# Patient Record
Sex: Male | Born: 1960
Health system: Southern US, Community
[De-identification: ages and names within clinical notes are randomized; demographics above are authoritative.]

---

## 2000-12-27 ENCOUNTER — Inpatient Hospital Stay (HOSPITAL_COMMUNITY): Admission: EM | Admit: 2000-12-27 | Discharge: 2000-12-29 | Payer: Self-pay | Admitting: Emergency Medicine

## 2000-12-27 ENCOUNTER — Encounter: Payer: Self-pay | Admitting: Emergency Medicine

## 2002-10-29 ENCOUNTER — Ambulatory Visit (HOSPITAL_COMMUNITY): Admission: RE | Admit: 2002-10-29 | Discharge: 2002-10-29 | Payer: Self-pay | Admitting: Internal Medicine

## 2002-11-13 ENCOUNTER — Encounter: Payer: Self-pay | Admitting: Internal Medicine

## 2002-11-13 ENCOUNTER — Inpatient Hospital Stay (HOSPITAL_COMMUNITY): Admission: EM | Admit: 2002-11-13 | Discharge: 2002-11-16 | Payer: Self-pay | Admitting: Emergency Medicine

## 2002-11-13 ENCOUNTER — Ambulatory Visit (HOSPITAL_COMMUNITY): Admission: RE | Admit: 2002-11-13 | Discharge: 2002-11-13 | Payer: Self-pay | Admitting: Internal Medicine

## 2002-11-16 ENCOUNTER — Encounter: Payer: Self-pay | Admitting: Internal Medicine

## 2009-10-27 ENCOUNTER — Encounter: Payer: Self-pay | Admitting: Internal Medicine

## 2009-10-29 ENCOUNTER — Ambulatory Visit: Payer: Self-pay | Admitting: Internal Medicine

## 2009-10-29 ENCOUNTER — Ambulatory Visit (HOSPITAL_COMMUNITY): Admission: RE | Admit: 2009-10-29 | Discharge: 2009-10-29 | Payer: Self-pay | Admitting: Internal Medicine

## 2010-07-13 NOTE — Letter (Signed)
Summary: Internal Other Domingo Dimes  Internal Other Domingo Dimes   Imported By: Cloria Spring LPN 16/03/9603 54:09:81  _____________________________________________________________________  External Attachment:    Type:   Image     Comment:   External Document

## 2010-08-30 LAB — GLUCOSE, CAPILLARY: Glucose-Capillary: 254 mg/dL — ABNORMAL HIGH (ref 70–99)

## 2010-10-29 NOTE — Procedures (Signed)
NAME:  Nicholas Bell, Nicholas Bell NO.:  [id number]   MEDICAL RECORD NO.:  [id number]                   PATIENT TYPE:  OUT   LOCATION:  RAD                                  FACILITY:  [facility]   PHYSICIAN:  Kem Boroughs, M.D.                 DATE OF BIRTH:  03/11/1961   DATE OF PROCEDURE:  10/29/2002  DATE OF DISCHARGE:                                  ECHOCARDIOGRAM   PROCEDURE:  Echocardiogram   SURGEON:  Sherral Hammers, M.D.   REFERRING PHYSICIAN:  Madelin Rear. Sherwood Gambler, M.D. and Kem Boroughs, M.D.   INDICATIONS:  Mr. Malecha is a 50 year old male without prior cardiac  history who has been experiencing chest discomfort.   TECHNICAL QUALITY:  The technical quality of this study is adequate.   FINDINGS:  1. The aorta is within normal limits.  2. The left atrium is mildly dilated at 4.57 cm.  No obvious clots or masses     were appreciated.  The patient appeared to be in sinus rhythm during this     procedure.  3. The interventricular septum and posterior wall were within normal limits     in thickness at 1.0 cm and 0.9 cm, respectively.  4. The aortic valve was reasonably structurally normal with good leaflet     excursion.  No obvious aortic insufficiency was noted.  Leaflet excursion     appeared normal.  Doppler interrogation of the aortic valve was within     normal limits.  5. The mitral valve also appeared reasonably structurally normal with good     leaflet excursion.  No mitral valve prolapse was noted.  Doppler     interrogation of the mitral valve was within normal limits.  Trivial     mitral regurgitation was perceived.  6. The pulmonic valve was incompletely visualized, but appeared grossly     structurally normal.  7. The tricuspid valve also appeared grossly structurally normal with mild     tricuspid regurgitation.  8. The left ventricular was normal in size with the LVIDD measuring 4.2 cm     and the LVIC measured 3.2 cm.  Overall the left  ventricular systolic     function appeared to be mildly depressed to low normal with an estimated     ejection fraction of 45-50%.  The right atrium and right ventricle are     normal in size. There was a circumferential small pericardial effusion     without evidence of hemodynamic compromise.  There is mild global     hypokinesis without regional wall motion abnormalities noted.   IMPRESSION:  1. Mild left atrial enlargement.  2. Valvular function appears reasonably normal.  3. Mild tricuspid regurgitation.  4. Trivial mitral regurgitation.  5. The left ventricle is normal in size with mildly depressed ejection     fraction to low-normal with an EF estimated  at 45-50%.  6. There is mild global hypokinesis without regional wall motion     abnormalities noted.  7. There is a small circumferential pericardial effusion without evidence     for hemodynamic compromise (in the setting of chest pain you may wish to     consider a diagnosis of pericarditis).  8. Recommend follow up echocardiogram in the next 2-3 months to evaluate     pericardial effusion.                                               Kem Boroughs, M.D.    TB/MEDQ  D:  10/30/2002  T:  10/30/2002  Job:  [id number]   cc:   Madelin Rear. Sherwood Gambler, M.D.  [address]  [place]  [place] [address]  Fax: [phone]

## 2010-10-29 NOTE — Procedures (Signed)
   NAME:  Nicholas Bell, Nicholas Bell NO.:  [id number]   MEDICAL RECORD NO.:  [id number]                   PATIENT TYPE:  PINP   LOCATION:                                       FACILITY:  [facility]   PHYSICIAN:  Kem Boroughs, M.D.                 DATE OF BIRTH:  August 31, 1960   DATE OF PROCEDURE:  11/15/2002  DATE OF DISCHARGE:  11/16/2002                                  ECHOCARDIOGRAM   REFERRING PHYSICIAN:  Kem Boroughs, M.D. and Madelin Rear. Sherwood Gambler, M.D.   PROCEDURE:  Echocardiogram   SURGEON:  Sherral Hammers, M.D.   INDICATIONS:  Nicholas Bell is a 50 year old gentleman with a prior history of  pericarditis who is referred for repeat limited echocardiogram to evaluate  for change in pericardial effusion noted 2-3 weeks ago on echo.  He has  since been admitted with chest pain and shortness of breath.   The technical quality of this study was adequate. The echo continues to  exhibit a quite small (tiny) circumferential pericardial effusion without  evidence of hemodynamic compromise.  There has been no change sine prior  echocardiogram in May 2004.                                               Kem Boroughs, M.D.    TB/MEDQ  D:  11/15/2002  T:  11/15/2002  Job:  [id number]

## 2010-10-29 NOTE — Cardiovascular Report (Signed)
[facility]icholas Bell. [facility]  Patient:    Nicholas Bell, Nicholas Bell                        MRN: [id number] Proc. Date: 12/27/00 Adm. Date:  [id number] Attending:  Delrae Rend R CC:         Delrae Rend, M.D., [facility]  Cardiac Catheterization Laboratory   Cardiac Catheterization  PROCEDURES PERFORMED: 1. Selective coronary angiography by Judkins technique. 2. Retrograde left heart catheterization. 3. Left ventricular angiography.  COMPLICATIONS:  None.  ENTRY SITE:  Right femoral.  DYE USED:  Omnipaque.  PATIENT PROFILE:  The patient is a very pleasant 50 year old, African-American gentleman, who developed acute onset of chest pain last evening that was sharp.  It is now pleuritic.  He presented to the [facility] Emergency Room and was found to have ST segment elevations on his ECG, and there was a clinical suspicion of acute myocardial infarction.  The patient was transferred to [facility] and was evaluated by Dr. Jacinto Halim.  He felt that the patient should undergo catheterization to rule out the possibility of an acute ischemic syndrome.  The patient entered the catheterization lab for that procedure on an elective inpatient basis.  RESULTS:  PRESSURES:  The left ventricular pressure was 105/18, central aortic pressure was 105/75, mean of 85.  No aortic valve gradient by pullback technique.  ANGIOGRAPHIC RESULTS:  The left main coronary artery was normal.  The LAD wrapped around the apex and was a dominant vessel in circulation.  An optional diagonal branch arose from the LAD, both optional diagonal and LAD were normal.  The circumflex was nondominant.  It did give rise to an intermediate ramus branch that trifurcated distally with no lesions.  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  The vessel itself was normal and nondominant.  The  left ventricle showed excellent contractility with no wall motion abnormalities.  I would estimate ejection fraction at 55-60%.  No LV thrombus. No mitral regurgitation.  No mitral prolapse.  Right percutaneous femoral Perclose arteriotomy was performed after the procedure with success and without complication.  FINAL DIAGNOSIS:  Acute onset chest pain.  CLINICAL DIAGNOSES: 1. Acute pericarditis. 2. Angiographically patent coronary arteries. 3. Normal left ventricular systolic function.  PLAN: 1. A 2-D echocardiogram, lupus and rheumatoid arthritis work-up. 2. IV Decadron was given to the patient in the catheterization lab 4 mg IV for    relief of acute chest pain and we plan nonsteroidal agents in the form of    Vioxx for pain relief. DD:  12/27/00 TD:  12/28/00 Job: [id number]

## 2010-10-29 NOTE — Discharge Summary (Signed)
[place]. [facility]  Patient:    Nicholas Bell, Nicholas Bell                        MRN: [id number] Adm. Date:  [id number] Disc. Date: [id number] Attending:  Delrae Rend R Dictator:   Marya Fossa, P.A. CCJeanella Cara, M.D.  at [facility]   Discharge Summary  DATE OF BIRTH:  01-18-1961.  ADMITTING DIAGNOSES: 1. Chest pain, rule out myocardial infarction. 2. No prior cardiac history. 3. Unknown lipid status.  DISCHARGE DIAGNOSES: 1. Acute pericarditis, probable viral etiology.  No evidence of tamponade. 2. Normal coronary. 3. Normal left ventricular function. 4. Favorable lipid profile.  HISTORY OF PRESENT ILLNESS:  Nicholas Bell is a 50 year old married black male with no prior cardiac history.  Denies diabetes, hypertension, hyperlipidemia, or cigarette use.  He does smoke cigars on occasions.  He presents to [facility] Emergency Room on December 27, 2000 with complaints of chest pain. He was then transferred to [facility] for further evaluation.  The night prior to admission, he was watching television and had the onset of chest pain, which felt sharp, in the middle of his chest, and a 9/10 without radiation.  He took Tylenol.  The pain lasted for three hours and then eased down to a 4/10.  He had some left shoulder arm discomfort, felt weak, as well as short of breath.  No diaphoresis, nausea, or vomiting.  He was able to sleep but woke up at three in the morning and noticed his pain was still present.  Bending over and taking a deep breath made his pain worse.  He decided to go to the emergency room at [facility].  EKG showed T-wave depression in II and aVF of about 1.5 mm with some associated ST depression.  PROCEDURE:  Cardiac catheterization December 27, 2000 by Dr. Madaline Savage.  COMPLICATIONS:  None.  CONSULTANTS:  None.  HOSPITAL COURSE:  Nicholas Bell was accepted in  transfer from [facility] for evaluation of chest pain.  Symptoms were somewhat atypical and had no risk factors.  However, EKG had some nonspecific findings.  Upon transfer to [facility], repeat EKG suggested a diffuse pattern of ST elevation and T-wave inversion in lead III.  Cardiac enzymes were negative.  The patient was taken to the cardiac catheterization by Dr. Madaline Savage after being placed on IV nitroglycerin, aspirin, and Plavix.  Cardiac catheterization revealed normal coronary arteries and normal LV function.  No wall motion abnormalities were noted.  Perclose arteriotomy was performed after the procedure without complication.  The patients EKG evolved to show diffuse ST elevation indicative of acute pericarditis.  Sedimentation rate was normal at 7.  Rheumatoid factor was negative.  ANA pending at discharge.  She remained afebrile.  On December 29, 2000, the patients symptoms had improved.  He had less shortness of breath and less pleuritic chest pain.  He did develop a friction rub.  He was placed on Indocin.  He was felt stable for discharge to home on December 29, 2000 with a white blood cell count of 10.1, hemoglobin 14.2.  DISCHARGE INSTRUCTIONS:  The patient will be kept out of work until seen back by Dr. Jacinto Halim.  He will be on Indocin 50 mg t.i.d. x 2 weeks and Pepcid 20 mg b.i.d. x 2 weeks and then as  needed.  We will check a BMP next Wednesday, follow his renal function on Indocin.  A 2-D echocardiogram will be performed on Monday, January 01, 2001 at 11 a.m. in our [place] office.  He will follow up with Dr. Jacinto Halim on January 09, 2001 at 12:15 in the [place] office.  He is to call us for any problems or questions sooner.  We will instruct the patient to shower but to keep his perclose bandage in place for two days and to refrain from swimming or soaking in the tub for one week. DD:  12/29/00 TD:  12/31/00 Job: [id number]

## 2010-10-29 NOTE — H&P (Signed)
NAME:  Nicholas Bell, Nicholas Bell NO.:  [id number]   MEDICAL RECORD NO.:  [id number]                   PATIENT TYPE:  EMS   LOCATION:  ED                                   FACILITY:  [facility]   PHYSICIAN:  Gracelyn Nurse, M.D.              DATE OF BIRTH:  26-May-1961   DATE OF ADMISSION:  11/13/2002  DATE OF DISCHARGE:                                HISTORY & PHYSICAL   CHIEF COMPLAINT:  Shortness of breath.   HISTORY OF PRESENT ILLNESS:  This is a 50 year old black male with a history  of viral pericarditis two years ago.  Today he presents with worsening  shortness of breath.  He has had shortness of breath with rest and with  activity for about one month.  He was being worked up as an outpatient.  He  was given steroids and anti-inflammatories.  He got better, but after  stopping the steroids he began to get worse pretty quickly.  He says that  the shortness of breath was much worse this past week and he started  developing some chest pain.  His primary care physician ordered a CT scan  today which showed a pericardial effusion and bilateral pleural effusions.  He was referred down to the emergency room for further evaluation.   PAST MEDICAL HISTORY:  1. Viral pericarditis two years ago.  2. Normal heart catheterization two years ago.   PAST SURGICAL HISTORY:  None.   CURRENT MEDICATIONS:  1. Nexium 40 mg daily.  2. Albuterol metered dose inhaler p.r.n.  These were both started this week.   SOCIAL HISTORY:  He smokes cigars.  He drinks alcohol occasionally.  He is  married with three children.  He does not do any drugs.  He does not have a  history of IV drug use.   FAMILY HISTORY:  His mother is 15 and has asthma.  His father is 25 and has  hypertension.   REVIEW OF SYSTEMS:  As per HPI.  Reminder of systems negative.   PHYSICAL EXAMINATION:  VITAL SIGNS:  Temperature 102 degrees, pulse 115,  respirations 18, blood pressure 111/71.  GENERAL  APPEARANCE:  This is a well-nourished white male who appears  uncomfortable.  HEENT:  Pupils equal, round, and reactive to light.  Extraocular movements  intact.  The oral mucosa is moist.  The oropharynx is clear.  NECK:  There is no JVD.  No lymphadenopathy.  No thyromegaly.  CARDIOVASCULAR:  Regular rate and rhythm.  There is a rub.  No murmurs.  LUNGS:  Decreased breath sounds bilaterally in the bases.  No rhonchi or  crackles.  ABDOMEN:  Soft, nontender, and nondistended.  Bowel sounds positive.  EXTREMITIES:  No edema.  NEUROLOGIC:  Cranial nerves II-XII grossly intact.  No focal deficits.  SKIN:  Moist with no rash.   ADMITTING LABORATORIES:  The pH was 7.47, pCO2  32, pO2 65, and bicarbonate  23 on room air.  White blood cell count 16,000, hemoglobin 13.7.  Glucose  288, sodium 134, potassium 4.0, chloride 99, CO2 27, BUN 7, creatinine 1.3,  glucose is 148.  CT scan shows pericardial thickening and a moderate  pericardial effusion.  It also shows bilateral pleural effusions.   ASSESSMENT AND PLAN:  1. Pericarditis.  He has had a repeated episode.  He does have the fever and     white count, so I am concerned about an infectious cause.  It may not be     viral at this time.  Question tuberculosis and also rheumatologic causes,     such as rheumatoid arthritis or lupus.  I will go ahead and check blood     cultures.  Will go ahead and start some empiric antibiotics since this     could represent manifestations of endocarditis.  Since he has had a     repeat episode in less than two years, more than likely he will need a     tissue biopsy to get to the root cause of this.  He has been seen by     [facility] before.  I will ask them to see him in the     morning to determine if this is the route to pursue.  Also, I did speak     with infectious disease at [facility].  They also recommend that     he go for tissue biopsy.  2. Pleural effusions.  This is likely  secondary to pericarditis.  They are     fairly small, so I doubt paracentesis would be warranted at this time.  3. Hyperglycemia.  No history of diabetes.  It is likely stress related.     Will check a hemoglobin A1c.                                               Gracelyn Nurse, M.D.    JDJ/MEDQ  D:  11/13/2002  T:  11/13/2002  Job:  [id number]

## 2010-10-29 NOTE — Discharge Summary (Signed)
   NAME:  Nicholas Bell, Nicholas Bell NO.:  [id number]   MEDICAL RECORD NO.:  [id number]                   PATIENT TYPE:  INP   LOCATION:  A217                                 FACILITY:  [facility]   PHYSICIAN:  Madelin Rear. Sherwood Gambler, M.D.             DATE OF BIRTH:  07-08-1960   DATE OF ADMISSION:  11/13/2002  DATE OF DISCHARGE:  11/16/2002                                 DISCHARGE SUMMARY   DISCHARGE DIAGNOSES:  1. Pleural pericarditis.  2. Possible atypical pneumonia.   SUMMARY:  The patient was admitted after outpatient treatment with  improvement and subsequent recrudescence of pleuritic chest pain.  He was  discharged on the following medications:  1. Prednisone 10 mg t.i.d.  2. Protonix 40 mg p.o. b.i.d.  3. Levapac 750 daily x5 days.   The patient was evaluated including cardiology consultation, echocardiogram,  and CT chest.  He was found to have pericardial effusion and a thickened  pericardium.  Incidental thyroid panel was questionable factitious result  with initial TSH low at 0.1 and follow-up free T4 normal with an elevated  TSH.  Consultation is pending with endocrinology regarding these puzzling  results, changing in 24 hours.  He improved with high-dose steroids and  proton pump inhibitor prophylaxis of his GI tract.  He will be followed  closely as an outpatient within the next week.  Continued aspirin therapy as  well as antibiotics as above.                                               Madelin Rear. Sherwood Gambler, M.D.    LJF/MEDQ  D:  11/16/2002  T:  11/16/2002  Job:  [id number]

## 2011-04-26 ENCOUNTER — Encounter: Payer: Self-pay | Admitting: *Deleted

## 2011-04-26 ENCOUNTER — Emergency Department (HOSPITAL_COMMUNITY)
Admission: EM | Admit: 2011-04-26 | Discharge: 2011-04-26 | Disposition: A | Discharge status: Home / Self Care | Payer: 59 | Attending: Emergency Medicine | Admitting: Emergency Medicine

## 2011-04-26 DIAGNOSIS — Z79899 Other long term (current) drug therapy: Secondary | ICD-10-CM | POA: Insufficient documentation

## 2011-04-26 DIAGNOSIS — L0201 Cutaneous abscess of face: Secondary | ICD-10-CM | POA: Insufficient documentation

## 2011-04-26 DIAGNOSIS — L03211 Cellulitis of face: Secondary | ICD-10-CM | POA: Insufficient documentation

## 2011-04-26 DIAGNOSIS — E119 Type 2 diabetes mellitus without complications: Secondary | ICD-10-CM | POA: Insufficient documentation

## 2011-04-26 MED ORDER — LIDOCAINE HCL 2 % IJ SOLN
10.0000 mL | Freq: Once | INTRAMUSCULAR | Status: DC
  Filled 2011-04-26: qty 10

## 2011-04-26 MED ORDER — LIDOCAINE-EPINEPHRINE (PF) 2 %-1:200000 IJ SOLN
INTRAMUSCULAR | Status: AC
  Administered 2011-04-26: 19:00:00
  Filled 2011-04-26: qty 20

## 2011-04-26 NOTE — ED Notes (Signed)
Pt c/o large knot to left side of face. Pt states it came up about a week and a half ago.

## 2011-04-26 NOTE — ED Notes (Signed)
Pt has noted abscess on left temporal area of face.  No drainage noted. Pt states has been there x 4-5 days and growing.

## 2011-04-27 NOTE — ED Provider Notes (Signed)
History     CSN: [id number] Arrival date & time: 04/26/2011  5:09 PM   First MD Initiated Contact with Patient 04/26/11 1711      Chief Complaint  Patient presents with  . knot to left side of face     (Consider location/radiation/quality/duration/timing/severity/associated sxs/prior treatment) Patient is a 50 y.o. male presenting with abscess. The history is provided by the patient.  Abscess  This is a recurrent problem. The current episode started more than one week ago. The onset was gradual. The problem has been gradually worsening. The abscess is present on the face. The problem is moderate. The abscess is characterized by swelling (Patient denies pain and drainage.). Pertinent negatives include no fever, no congestion, no rhinorrhea and no sore throat.    Past Medical History  Diagnosis Date  . Diabetes mellitus     History reviewed. No pertinent past surgical history.  History reviewed. No pertinent family history.  History  Substance Use Topics  . Smoking status: Passive Smoker    Types: Cigarettes  . Smokeless tobacco: Not on file  . Alcohol Use: No      Review of Systems  Constitutional: Negative for fever.  HENT: Negative for ear pain, congestion, sore throat, rhinorrhea, neck pain, sinus pressure and ear discharge.   Eyes: Negative.   Respiratory: Negative for chest tightness and shortness of breath.   Cardiovascular: Negative for chest pain.  Gastrointestinal: Negative for nausea and abdominal pain.  Genitourinary: Negative.   Musculoskeletal: Negative for joint swelling and arthralgias.  Skin: Negative for rash and wound.  Neurological: Negative for dizziness, weakness, light-headedness, numbness and headaches.  Hematological: Negative.   Psychiatric/Behavioral: Negative.     Allergies  Review of patient's allergies indicates no known allergies.  Home Medications   Current Outpatient Rx  Name Route Sig Dispense Refill  . ENALAPRIL MALEATE 2.5  MG PO TABS Oral Take 2.5 mg by mouth daily.      Marland Kitchen GLIPIZIDE 5 MG PO TABS Oral Take 5 mg by mouth daily.      Marland Kitchen METFORMIN HCL 500 MG PO TABS Oral Take 500 mg by mouth daily.      Eloy End HCL 5.4 MG PO TABS Oral Take 5.4 mg by mouth as needed. For energy       BP 130/83  Pulse 98  Temp(Src) 98.6 F (37 C) (Oral)  Resp 16  Ht 5\' 8"  (1.727 m)  Wt 160 lb (72.576 kg)  BMI 24.33 kg/m2  SpO2 100%  Physical Exam  Nursing note and vitals reviewed. Constitutional: He is oriented to person, place, and time. He appears well-developed and well-nourished.  HENT:  Head: Atraumatic.    Right Ear: External ear normal.  Left Ear: External ear normal.  Eyes: Conjunctivae are normal.  Neck: Normal range of motion.  Cardiovascular: Normal rate, regular rhythm, normal heart sounds and intact distal pulses.   Pulmonary/Chest: Effort normal and breath sounds normal. He has no wheezes.  Abdominal: Soft. Bowel sounds are normal. There is no tenderness.  Musculoskeletal: Normal range of motion.  Neurological: He is alert and oriented to person, place, and time.  Skin: Skin is warm and dry.  Psychiatric: He has a normal mood and affect.    ED Course  Procedures (including critical care time)  Labs Reviewed - No data to display No results found.   1. Abscess of face     INCISION AND DRAINAGE Performed by: Candis Musa Consent: Verbal consent obtained. Risks and benefits: risks,  benefits and alternatives were discussed Type: abscess  Body area: temple  Anesthesia: local infiltration  Local anesthetic: lidocaine 2%  With epinephrine  Anesthetic total: 1 ml  Complexity: complex Blunt dissection to break up loculations  Drainage: purulent  Drainage amount: moderate  Packing material: 1/4 in iodoform gauze  Patient tolerance: Patient tolerated the procedure well with no immediate complications.     MDM  Abscess/  Given chronicity,  Suspect infected sebaceous cyst.  Unable  to locate core.  Flushed until drained clear,  Modest amount of sebum along with more liquid,  Purulent drainage obtained.  Referral to Dr. Margo Aye for definitive cosmetic resolution of this chronic skin lesion.  Warm soaks advised.  No abx indicated - no cellulitis.      z  Candis Musa, [place] 04/27/11 1319

## 2011-04-27 NOTE — ED Provider Notes (Signed)
Medical screening examination/treatment/procedure(s) were performed by non-physician practitioner and as supervising physician I was immediately available for consultation/collaboration.   Rasheen Bells L Jenne Sellinger, MD 04/27/11 1528 

## 2013-08-01 ENCOUNTER — Emergency Department (HOSPITAL_COMMUNITY)
Admission: EM | Admit: 2013-08-01 | Discharge: 2013-08-01 | Disposition: A | Discharge status: Home / Self Care | Payer: 59 | Attending: Emergency Medicine | Admitting: Emergency Medicine

## 2013-08-01 ENCOUNTER — Encounter (HOSPITAL_COMMUNITY): Payer: Self-pay | Admitting: Emergency Medicine

## 2013-08-01 DIAGNOSIS — Z9119 Patient's noncompliance with other medical treatment and regimen: Secondary | ICD-10-CM | POA: Insufficient documentation

## 2013-08-01 DIAGNOSIS — R739 Hyperglycemia, unspecified: Secondary | ICD-10-CM

## 2013-08-01 DIAGNOSIS — R42 Dizziness and giddiness: Secondary | ICD-10-CM | POA: Insufficient documentation

## 2013-08-01 DIAGNOSIS — E119 Type 2 diabetes mellitus without complications: Secondary | ICD-10-CM | POA: Insufficient documentation

## 2013-08-01 DIAGNOSIS — R0602 Shortness of breath: Secondary | ICD-10-CM | POA: Insufficient documentation

## 2013-08-01 DIAGNOSIS — Z79899 Other long term (current) drug therapy: Secondary | ICD-10-CM | POA: Insufficient documentation

## 2013-08-01 DIAGNOSIS — Z91199 Patient's noncompliance with other medical treatment and regimen due to unspecified reason: Secondary | ICD-10-CM | POA: Insufficient documentation

## 2013-08-01 LAB — COMPREHENSIVE METABOLIC PANEL
ALT: 22 U/L (ref 0–53)
AST: 21 U/L (ref 0–37)
Albumin: 4.4 g/dL (ref 3.5–5.2)
Alkaline Phosphatase: 154 U/L — ABNORMAL HIGH (ref 39–117)
BILIRUBIN TOTAL: 0.7 mg/dL (ref 0.3–1.2)
BUN: 19 mg/dL (ref 6–23)
CALCIUM: 10 mg/dL (ref 8.4–10.5)
CHLORIDE: 93 meq/L — AB (ref 96–112)
CO2: 25 meq/L (ref 19–32)
CREATININE: 0.92 mg/dL (ref 0.50–1.35)
GFR calc Af Amer: >90 mL/min (ref >90)
Glucose, Bld: 497 mg/dL — ABNORMAL HIGH (ref 70–99)
Potassium: 4.9 mEq/L (ref 3.7–5.3)
Sodium: 134 mEq/L — ABNORMAL LOW (ref 137–147)
Total Protein: 9.2 g/dL — ABNORMAL HIGH (ref 6.0–8.3)

## 2013-08-01 LAB — CBC WITH DIFFERENTIAL/PLATELET
BASOS ABS: 0 10*3/uL (ref 0.0–0.1)
BASOS PCT: 0 % (ref 0–1)
EOS PCT: 0 % (ref 0–5)
Eosinophils Absolute: 0 10*3/uL (ref 0.0–0.7)
HEMATOCRIT: 49.2 % (ref 39.0–52.0)
HEMOGLOBIN: 16.2 g/dL (ref 13.0–17.0)
Lymphocytes Relative: 5 % — ABNORMAL LOW (ref 12–46)
Lymphs Abs: 0.5 10*3/uL — ABNORMAL LOW (ref 0.7–4.0)
MCH: 27.6 pg (ref 26.0–34.0)
MCHC: 32.9 g/dL (ref 30.0–36.0)
MCV: 83.8 fL (ref 78.0–100.0)
MONO ABS: 0.3 10*3/uL (ref 0.1–1.0)
MONOS PCT: 3 % (ref 3–12)
Neutro Abs: 8.5 10*3/uL — ABNORMAL HIGH (ref 1.7–7.7)
Neutrophils Relative %: 92 % — ABNORMAL HIGH (ref 43–77)
Platelets: 147 10*3/uL — ABNORMAL LOW (ref 150–400)
RBC: 5.87 MIL/uL — ABNORMAL HIGH (ref 4.22–5.81)
RDW: 12.8 % (ref 11.5–15.5)
WBC: 9.3 10*3/uL (ref 4.0–10.5)

## 2013-08-01 LAB — CBG MONITORING, ED
GLUCOSE-CAPILLARY: 346 mg/dL — AB (ref 70–99)
Glucose-Capillary: 474 mg/dL — ABNORMAL HIGH (ref 70–99)

## 2013-08-01 MED ORDER — SODIUM CHLORIDE 0.9 % IV BOLUS (SEPSIS)
1000.0000 mL | Freq: Once | INTRAVENOUS | Status: AC
  Administered 2013-08-01: 1000 mL via INTRAVENOUS

## 2013-08-01 MED ORDER — METFORMIN HCL 500 MG PO TABS: 500.0000 mg | ORAL_TABLET | Freq: Two times a day (BID) | ORAL | Status: DC

## 2013-08-01 NOTE — ED Provider Notes (Signed)
CSN: [id number]     Arrival date & time 08/01/13  1120 History  This chart was scribed for Benny Lennert, MD by Leone Payor, ED Scribe. This patient was seen in room APA06/APA06 and the patient's care was started 11:47 AM.    Chief Complaint  Patient presents with  . Hyperglycemia      Patient is a 53 y.o. male presenting with hyperglycemia. The history is provided by the patient. No language interpreter was used.  Hyperglycemia Blood sugar level PTA:  400's  Severity:  Moderate Onset quality:  Gradual Duration:  2 hours Timing:  Constant Diabetes status:  Controlled with oral medications Current diabetic therapy:  None because he discontinued medications  Time since last antidiabetic medication:  2 months Context: noncompliance   Context: not recent illness   Ineffective treatments:  None tried Associated symptoms: dizziness and shortness of breath   Associated symptoms: no abdominal pain, no chest pain and no fatigue     HPI Comments: Nicholas Bell is a 53 y.o. male with past medical history of DM who presents to the Emergency Department complaining of 2 hours of intermittent episodes of dizziness and SOB. Pt states he checked his blood glucose levels and noticed they were in the 400's. He has a history of DM but has not taken his medications for the past 2.5 months. He states that he has been exercising and feeling healthy so he discontinued the medications.   PCP in [place].  Past Medical History  Diagnosis Date  . Diabetes mellitus    History reviewed. No pertinent past surgical history. No family history on file. History  Substance Use Topics  . Smoking status: Passive Smoke Exposure - Never Smoker    Types: Cigarettes  . Smokeless tobacco: Not on file  . Alcohol Use: No    Review of Systems  Constitutional: Negative for appetite change and fatigue.  HENT: Negative for congestion, ear discharge and sinus pressure.   Eyes: Negative for discharge.  Respiratory:  Positive for shortness of breath. Negative for cough.   Cardiovascular: Negative for chest pain.  Gastrointestinal: Negative for abdominal pain and diarrhea.  Genitourinary: Negative for frequency and hematuria.  Musculoskeletal: Negative for back pain.  Skin: Negative for rash.  Neurological: Positive for dizziness. Negative for seizures and headaches.  Psychiatric/Behavioral: Negative for hallucinations.      Allergies  Review of patient's allergies indicates no known allergies.  Home Medications   Current Outpatient Rx  Name  Route  Sig  Dispense  Refill  . enalapril (VASOTEC) 2.5 MG tablet   Oral   Take 2.5 mg by mouth daily.           Marland Kitchen glipiZIDE (GLUCOTROL) 5 MG tablet   Oral   Take 5 mg by mouth daily.           . metFORMIN (GLUCOPHAGE) 500 MG tablet   Oral   Take 500 mg by mouth daily.           . yohimbine 5.4 MG tablet   Oral   Take 5.4 mg by mouth as needed. For energy           BP 126/77  Pulse 112  Temp(Src) 98.4 F (36.9 C)  Resp 18  Ht 5\' 8"  (1.727 m)  Wt 150 lb (68.04 kg)  BMI 22.81 kg/m2  SpO2 99% Physical Exam  Nursing note and vitals reviewed. Constitutional: He is oriented to person, place, and time. He appears well-developed.  HENT:  Head: Normocephalic.  Eyes: Conjunctivae and EOM are normal. No scleral icterus.  Neck: Neck supple. No thyromegaly present.  Cardiovascular: Normal rate and regular rhythm.  Exam reveals no gallop and no friction rub.   No murmur heard. Pulmonary/Chest: Effort normal and breath sounds normal. No stridor. He has no wheezes. He has no rales. He exhibits no tenderness.  Abdominal: He exhibits no distension. There is no tenderness. There is no rebound.  Musculoskeletal: Normal range of motion. He exhibits no edema.  Lymphadenopathy:    He has no cervical adenopathy.  Neurological: He is oriented to person, place, and time. He exhibits normal muscle tone. Coordination normal.  Skin: No rash noted. No  erythema.  Psychiatric: He has a normal mood and affect. His behavior is normal.    ED Course  Procedures (including critical care time)  DIAGNOSTIC STUDIES: Oxygen Saturation is 99% on RA, normal by my interpretation.    COORDINATION OF CARE: 11:55 AM Discussed treatment plan with pt at bedside and pt agreed to plan.   Labs Review Labs Reviewed  CBC WITH DIFFERENTIAL - Abnormal; Notable for the following:    RBC 5.87 (*)    Platelets 147 (*)    Neutrophils Relative % 92 (*)    Neutro Abs 8.5 (*)    Lymphocytes Relative 5 (*)    Lymphs Abs 0.5 (*)    All other components within normal limits  COMPREHENSIVE METABOLIC PANEL - Abnormal; Notable for the following:    Sodium 134 (*)    Chloride 93 (*)    Glucose, Bld 497 (*)    Total Protein 9.2 (*)    Alkaline Phosphatase 154 (*)    All other components within normal limits  CBG MONITORING, ED - Abnormal; Notable for the following:    Glucose-Capillary 474 (*)    All other components within normal limits  CBG MONITORING, ED - Abnormal; Notable for the following:    Glucose-Capillary 346 (*)    All other components within normal limits   Imaging Review No results found.  EKG Interpretation   None       MDM   Final diagnoses:  None    Hyperglycemia.  tx with glucophage and close follow up  The chart was scribed for me under my direct supervision.  I personally performed the history, physical, and medical decision making and all procedures in the evaluation of this patient.Benny Lennert.   Anamaria Dusenbury L Blaine Guiffre, MD 08/01/13 [phone]

## 2013-08-01 NOTE — ED Notes (Signed)
Pt c/o dizziness and blood sugar 420 pta.

## 2013-08-01 NOTE — Discharge Instructions (Signed)
Follow up with Dr. Juanetta GoslingHawkins or Dr. Janna Archondiego or Dr. Karilyn CotaGosrani    Next week to follow up on your sugar

## 2014-04-23 ENCOUNTER — Encounter: Payer: 59 | Admitting: Nutrition

## 2014-04-24 ENCOUNTER — Encounter: Payer: BC Managed Care – PPO | Attending: "Endocrinology | Admitting: Nutrition

## 2014-04-24 ENCOUNTER — Encounter: Payer: Self-pay | Admitting: Nutrition

## 2014-04-24 VITALS — Ht 68.0 in | Wt 141.0 lb

## 2014-04-24 DIAGNOSIS — E1065 Type 1 diabetes mellitus with hyperglycemia: Secondary | ICD-10-CM | POA: Insufficient documentation

## 2014-04-24 DIAGNOSIS — Z713 Dietary counseling and surveillance: Secondary | ICD-10-CM | POA: Insufficient documentation

## 2014-04-24 DIAGNOSIS — Z794 Long term (current) use of insulin: Secondary | ICD-10-CM | POA: Insufficient documentation

## 2014-04-24 DIAGNOSIS — IMO0002 Reserved for concepts with insufficient information to code with codable children: Secondary | ICD-10-CM

## 2014-04-24 NOTE — Patient Instructions (Signed)
Plan:  Aim for 3-4 Carb Choices per meal (45-60 grams) +/- 1 either way  No snacks between meals unless blood sugars are low Include protein in moderation with your meals and snacks Test blood sugar before each meal, inject Novolog insulin per sliding scale and then eat meal. Do not test blood sugars after meals and then dose insulin based on those numbers. Keep a food journal daily and bring at next visit. Comply with medications and insulin doses.  Goal 1. Get A1C down to 9% in three months. 2. Take insulin as prescribed. 3. Put money aside for prescription insulin monthly.

## 2014-04-24 NOTE — Progress Notes (Signed)
  Medical Nutrition Therapy:  Appt start time: 1000 end time:  1045.  Assessment:  Primary concerns today: Diabetes Type 1. Work 2 job and lives by himself and does his own shopping and cooking. He reports taking 20 units of Toujeo daily at night and 5 units of Novolog AFTER meals. He brought in BS log. BS are mostly high 200's-high 300's. Pt. Is not compliant in following insulin regimen. Stressed need to save money aside to cover expenses of Novolog monthly. Reviewed need to inject Novolog insulin based on sliding scale of BS BEFORE meals.   Preferred Learning Style:   No preference indicated   Learning Readiness:    Ready  Change in progress   MEDICATIONS: see list; Suppose to be on 24 units of Toujeo and sliding scale Novolog with meals.    DIETARY INTAKE:  24-hr recall:  B ( 7 AM): Shredded mini wheat or raisin bran cereal,  coffee Snk ( AM): none L ( PM): 3 tacos at [employer], water Snk ( PM): noe D ( 6-9 PM): Toss salad, chicken tenders, water Snk ( PM): nothing usually Beverages: water, gatorade  Usual physical activity: ADL's  Estimated energy needs: 2000 calories 225 g g carbohydrates 150 g protein 58 g fat  Progress Towards Goal(s):  In progress.   Nutritional Diagnosis:  NB-1.1 Food and nutrition-related knowledge deficit As related to Diabetes.  As evidenced by A1C >12%.    Intervention:  Nutrition counseling and diabetes education and proper insulin administration. Reeducated on carb counting, meal planning, target goals for blood sugars and complications from DM. Reviewed proper insulin administration and importance of medication compliance.  Plan:  Aim for 3-4 Carb Choices per meal (45-60 grams) +/- 1 either way  No snacks between meals unless blood sugars are low Include protein in moderation with your meals and snacks Test blood sugar before each meal, inject Novolog insulin per sliding scale and then eat meal. Do not test blood sugars after meals  and then dose insulin based on those numbers. Keep a food journal daily and bring at next visit. Comply with medications and insulin doses.  Goal 1. Get A1C down to 9% in three months. 2. Take insulin as prescribed. 3. Put money aside for prescription insulin monthly.  Teaching Method Utilized:  Visual Auditory Hands on  Handouts given during visit include: Carb Counting and Food Label handouts Meal Plan Card The Plate Method.  Barriers to learning/adherence to lifestyle change: none  Demonstrated degree of understanding via:  Teach Back   Monitoring/Evaluation:  Dietary intake, exercise, meal planning, and body weight in 1 week(s).

## 2014-04-28 ENCOUNTER — Ambulatory Visit: Payer: 59 | Admitting: Nutrition

## 2014-05-05 ENCOUNTER — Encounter: Payer: BC Managed Care – PPO | Admitting: Nutrition

## 2014-05-05 VITALS — Ht 68.0 in | Wt 152.4 lb

## 2014-05-05 DIAGNOSIS — Z713 Dietary counseling and surveillance: Secondary | ICD-10-CM | POA: Diagnosis not present

## 2014-05-05 DIAGNOSIS — Z794 Long term (current) use of insulin: Secondary | ICD-10-CM | POA: Diagnosis not present

## 2014-05-05 DIAGNOSIS — E108 Type 1 diabetes mellitus with unspecified complications: Principal | ICD-10-CM

## 2014-05-05 DIAGNOSIS — E1065 Type 1 diabetes mellitus with hyperglycemia: Secondary | ICD-10-CM | POA: Diagnosis present

## 2014-05-05 NOTE — Progress Notes (Signed)
  Medical Nutrition Therapy:  Appt start time: 1000 end time:  1045.  Assessment:  Primary concerns today: Diabetes Type 1. Changes made since last visit: Trying to eating better balanced meals. Cut out gatorade. Notes he is taking 20 units of Toujeo when he was suppose to be taking 24 units per last MD visit with Dr. Fransico HimNida. States he is taking his Novolog before he eats his meals.  Time on his meter was off a couple of hours and got that corrected to be more accurate with blood sugars and timing of testing. He appears to be eating 4 meals per day due to his work schedule of trying to work 2 jobs. 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 Didn't test blood sugars for a whole week. He is working on diet and medication compliance. BS still remain high in the upper 200-300's.  Dr. Fransico HimNida increased his Toujeo to 30 units daily and 10 units of Novolog with meals today. He verbalized understanding. Reiterated that he needs to write his blood sugars down on sheet at the time of testing and not just download numbers and put on sheet randomly.  Preferred Learning Style:   No preference indicated   Learning Readiness:    Ready  Change in progress   MEDICATIONS: see list; Suppose to be on 24 units of Toujeo and sliding scale Novolog with meals.    DIETARY INTAKE:  24-hr recall:  B (  7 AM): Cherrios with whole milk,  coffee Snk ( AM): none L ( PM): CHicken salad sandwich, water Snk ( PM): no D ( 6-9 PM): Hamburger with bun -2; water Snk ( PM): nabs, water Beverages: water,   Usual physical activity: ADL's  Estimated energy needs: 2000 calories 225 g g carbohydrates 150 g protein 58 g fat  Progress Towards Goal(s):  In progress.   Nutritional Diagnosis:  NB-1.1 Food and nutrition-related knowledge deficit As related to Diabetes.  As evidenced by A1C >12%.    Intervention:  Nutrition counseling and diabetes education and proper insulin  administration. Reeducated on carb counting, meal planning, target goals for blood sugars and complications from DM. Reviewed proper insulin administration and importance of medication compliance.  Plan:  Aim for 3-4 Carb Choices per meal (45-60 grams) +/- 1 either way  No snacks between meals unless blood sugars are low Only eat three meals per day unless having a low blood sugar. You do not need nabs in between meals unless BS is low. Include protein in moderation with your meals Test blood sugar before each meal, inject Novolog insulin per sliding scale and then eat meal. Take insulin of 24 units of Toujeo as prescribed and 10 units of Novolog before meals. Keep a food journal daily and bring at next visit. Comply with medications and insulin doses.  Goal 1. Get A1C down to 9% in three months. 2. Take insulin as prescribed. 3. Put money aside for prescription insulin monthly.  Teaching Method Utilized:  Visual Auditory Hands on  Handouts given during visit include: Carb Counting and Food Label handouts Meal Plan Card The Plate Method.  Barriers to learning/adherence to lifestyle change: none  Demonstrated degree of understanding via:  Teach Back   Monitoring/Evaluation:  Dietary intake, exercise, meal planning, and body weight in 1 month.

## 2014-05-05 NOTE — Patient Instructions (Signed)
im for 3-4 Carb Choices per meal (45-60 grams) +/- 1 either way  No snacks between meals unless blood sugars are low Only eat three meals per day unless having a low blood sugar. You do not need nabs in between meals unless BS is low. Include protein in moderation with your meals Test blood sugar before each meal, inject Novolog insulin per sliding scale and then eat meal. Take insulin of 24 units of Toujeo as prescribed and 10 units of Novolog before meals. Keep a food journal daily and bring at next visit. Comply with medications and insulin doses.  Goal 1. Get A1C down to 9% in three months. 2. Take insulin as prescribed. 3. Put money aside for prescription insulin monthly.

## 2014-05-23 ENCOUNTER — Encounter: Payer: 59 | Attending: "Endocrinology | Admitting: Nutrition

## 2014-05-23 DIAGNOSIS — E1065 Type 1 diabetes mellitus with hyperglycemia: Secondary | ICD-10-CM | POA: Insufficient documentation

## 2014-05-23 DIAGNOSIS — Z713 Dietary counseling and surveillance: Secondary | ICD-10-CM | POA: Insufficient documentation

## 2014-05-23 DIAGNOSIS — Z794 Long term (current) use of insulin: Secondary | ICD-10-CM | POA: Insufficient documentation

## 2015-04-24 ENCOUNTER — Emergency Department (HOSPITAL_COMMUNITY): Payer: Self-pay

## 2015-04-24 ENCOUNTER — Encounter (HOSPITAL_COMMUNITY): Payer: Self-pay | Admitting: Emergency Medicine

## 2015-04-24 ENCOUNTER — Inpatient Hospital Stay (HOSPITAL_COMMUNITY)
Admission: EM | Admit: 2015-04-24 | Discharge: 2015-04-30 | DRG: 122 | Disposition: A | Discharge status: Home / Self Care | Payer: Self-pay | Attending: Internal Medicine | Admitting: Internal Medicine

## 2015-04-24 DIAGNOSIS — Z91120 Patient's intentional underdosing of medication regimen due to financial hardship: Secondary | ICD-10-CM

## 2015-04-24 DIAGNOSIS — H05012 Cellulitis of left orbit: Principal | ICD-10-CM | POA: Diagnosis present

## 2015-04-24 DIAGNOSIS — T383X6A Underdosing of insulin and oral hypoglycemic [antidiabetic] drugs, initial encounter: Secondary | ICD-10-CM | POA: Diagnosis present

## 2015-04-24 DIAGNOSIS — IMO0001 Reserved for inherently not codable concepts without codable children: Secondary | ICD-10-CM | POA: Diagnosis present

## 2015-04-24 DIAGNOSIS — H05019 Cellulitis of unspecified orbit: Secondary | ICD-10-CM | POA: Diagnosis present

## 2015-04-24 DIAGNOSIS — Z9119 Patient's noncompliance with other medical treatment and regimen: Secondary | ICD-10-CM

## 2015-04-24 DIAGNOSIS — L03213 Periorbital cellulitis: Secondary | ICD-10-CM

## 2015-04-24 DIAGNOSIS — I10 Essential (primary) hypertension: Secondary | ICD-10-CM | POA: Diagnosis present

## 2015-04-24 DIAGNOSIS — E1165 Type 2 diabetes mellitus with hyperglycemia: Secondary | ICD-10-CM | POA: Diagnosis present

## 2015-04-24 DIAGNOSIS — H40052 Ocular hypertension, left eye: Secondary | ICD-10-CM | POA: Diagnosis present

## 2015-04-24 DIAGNOSIS — Z794 Long term (current) use of insulin: Secondary | ICD-10-CM

## 2015-04-24 DIAGNOSIS — H11422 Conjunctival edema, left eye: Secondary | ICD-10-CM | POA: Diagnosis present

## 2015-04-24 DIAGNOSIS — Z7722 Contact with and (suspected) exposure to environmental tobacco smoke (acute) (chronic): Secondary | ICD-10-CM | POA: Diagnosis present

## 2015-04-24 LAB — CBC
HCT: 45.2 % (ref 39.0–52.0)
Hemoglobin: 15.2 g/dL (ref 13.0–17.0)
MCH: 28 pg (ref 26.0–34.0)
MCHC: 33.6 g/dL (ref 30.0–36.0)
MCV: 83.2 fL (ref 78.0–100.0)
PLATELETS: 173 10*3/uL (ref 150–400)
RBC: 5.43 MIL/uL (ref 4.22–5.81)
RDW: 12.4 % (ref 11.5–15.5)
WBC: 9.3 10*3/uL (ref 4.0–10.5)

## 2015-04-24 LAB — BASIC METABOLIC PANEL
ANION GAP: 12 (ref 5–15)
BUN: 13 mg/dL (ref 6–20)
CALCIUM: 9.5 mg/dL (ref 8.9–10.3)
CO2: 29 mmol/L (ref 22–32)
Chloride: 95 mmol/L — ABNORMAL LOW (ref 101–111)
Creatinine, Ser: 1.05 mg/dL (ref 0.61–1.24)
GLUCOSE: 370 mg/dL — AB (ref 65–99)
Potassium: 4.7 mmol/L (ref 3.5–5.1)
SODIUM: 136 mmol/L (ref 135–145)

## 2015-04-24 LAB — LIPID PANEL
CHOL/HDL RATIO: 2.6 ratio
Cholesterol: 195 mg/dL (ref 0–200)
HDL: 74 mg/dL (ref >40)
LDL CALC: 105 mg/dL — AB (ref 0–99)
TRIGLYCERIDES: 80 mg/dL (ref <150)
VLDL: 16 mg/dL (ref 0–40)

## 2015-04-24 LAB — GLUCOSE, CAPILLARY
GLUCOSE-CAPILLARY: 287 mg/dL — AB (ref 65–99)
GLUCOSE-CAPILLARY: 226 mg/dL — AB (ref 65–99)

## 2015-04-24 LAB — CBG MONITORING, ED: Glucose-Capillary: 368 mg/dL — ABNORMAL HIGH (ref 65–99)

## 2015-04-24 MED ORDER — SODIUM CHLORIDE 0.9 % IV BOLUS (SEPSIS)
1000.0000 mL | Freq: Once | INTRAVENOUS | Status: AC
  Administered 2015-04-24: 1000 mL via INTRAVENOUS

## 2015-04-24 MED ORDER — SODIUM CHLORIDE 0.9 % IV BOLUS (SEPSIS)
1000.0000 mL | Freq: Once | INTRAVENOUS | Status: AC
Start: 2015-04-24 — End: 2015-04-24
  Administered 2015-04-24: 1000 mL via INTRAVENOUS

## 2015-04-24 MED ORDER — ONDANSETRON HCL 4 MG/2ML IJ SOLN: 4.0000 mg | Freq: Four times a day (QID) | INTRAMUSCULAR | Status: DC | PRN

## 2015-04-24 MED ORDER — MORPHINE SULFATE (PF) 2 MG/ML IV SOLN: 2.0000 mg | INTRAVENOUS | Status: DC | PRN

## 2015-04-24 MED ORDER — ACETAMINOPHEN 325 MG PO TABS
650.0000 mg | ORAL_TABLET | Freq: Four times a day (QID) | ORAL | Status: DC | PRN
  Administered 2015-04-24 – 2015-04-26 (×2): 650 mg via ORAL
  Filled 2015-04-24 (×2): qty 2

## 2015-04-24 MED ORDER — IOHEXOL 300 MG/ML  SOLN
75.0000 mL | Freq: Once | INTRAMUSCULAR | Status: AC | PRN
  Administered 2015-04-24: 75 mL via INTRAVENOUS

## 2015-04-24 MED ORDER — INSULIN ASPART 100 UNIT/ML ~~LOC~~ SOLN
0.0000 [IU] | Freq: Three times a day (TID) | SUBCUTANEOUS | Status: DC
  Administered 2015-04-24: 8 [IU] via SUBCUTANEOUS
  Administered 2015-04-25: 11 [IU] via SUBCUTANEOUS
  Administered 2015-04-25: 8 [IU] via SUBCUTANEOUS
  Administered 2015-04-26: 3 [IU] via SUBCUTANEOUS
  Administered 2015-04-26: 2 [IU] via SUBCUTANEOUS
  Administered 2015-04-26: 5 [IU] via SUBCUTANEOUS
  Administered 2015-04-27: 3 [IU] via SUBCUTANEOUS
  Administered 2015-04-27: 2 [IU] via SUBCUTANEOUS
  Administered 2015-04-28: 11 [IU] via SUBCUTANEOUS
  Administered 2015-04-28: 5 [IU] via SUBCUTANEOUS
  Administered 2015-04-28: 15 [IU] via SUBCUTANEOUS
  Administered 2015-04-29: 5 [IU] via SUBCUTANEOUS
  Administered 2015-04-29 – 2015-04-30 (×4): 3 [IU] via SUBCUTANEOUS

## 2015-04-24 MED ORDER — PROPARACAINE HCL 0.5 % OP SOLN: 1.0000 [drp] | Freq: Once | OPHTHALMIC | Status: DC

## 2015-04-24 MED ORDER — HYDRALAZINE HCL 20 MG/ML IJ SOLN
5.0000 mg | INTRAMUSCULAR | Status: DC | PRN
  Administered 2015-04-24 – 2015-04-30 (×2): 5 mg via INTRAVENOUS
  Filled 2015-04-24 (×2): qty 1

## 2015-04-24 MED ORDER — ONDANSETRON HCL 4 MG PO TABS: 4.0000 mg | ORAL_TABLET | Freq: Four times a day (QID) | ORAL | Status: DC | PRN

## 2015-04-24 MED ORDER — ACETAMINOPHEN 650 MG RE SUPP: 650.0000 mg | Freq: Four times a day (QID) | RECTAL | Status: DC | PRN

## 2015-04-24 MED ORDER — INFLUENZA VAC SPLIT QUAD 0.5 ML IM SUSY
0.5000 mL | PREFILLED_SYRINGE | INTRAMUSCULAR | Status: AC
  Administered 2015-04-25: 0.5 mL via INTRAMUSCULAR
  Filled 2015-04-24: qty 0.5

## 2015-04-24 MED ORDER — VANCOMYCIN HCL IN DEXTROSE 750-5 MG/150ML-% IV SOLN
750.0000 mg | Freq: Two times a day (BID) | INTRAVENOUS | Status: DC
  Administered 2015-04-24 – 2015-04-27 (×6): 750 mg via INTRAVENOUS
  Filled 2015-04-24 (×7): qty 150

## 2015-04-24 MED ORDER — TETRACAINE HCL 0.5 % OP SOLN
1.0000 [drp] | Freq: Once | OPHTHALMIC | Status: AC
  Administered 2015-04-24: 1 [drp] via OPHTHALMIC

## 2015-04-24 MED ORDER — LISINOPRIL 5 MG PO TABS
5.0000 mg | ORAL_TABLET | Freq: Every day | ORAL | Status: DC
  Administered 2015-04-24 – 2015-04-30 (×7): 5 mg via ORAL
  Filled 2015-04-24 (×7): qty 1

## 2015-04-24 MED ORDER — SODIUM CHLORIDE 0.9 % IV SOLN
INTRAVENOUS | Status: DC
  Administered 2015-04-24 – 2015-04-25 (×3): via INTRAVENOUS

## 2015-04-24 MED ORDER — FLUORESCEIN SODIUM 1 MG OP STRP
ORAL_STRIP | OPHTHALMIC | Status: AC
  Filled 2015-04-24: qty 1

## 2015-04-24 MED ORDER — AMPICILLIN-SULBACTAM SODIUM 3 (2-1) G IJ SOLR
3.0000 g | Freq: Four times a day (QID) | INTRAMUSCULAR | Status: DC
  Administered 2015-04-24 (×2): 3 g via INTRAVENOUS
  Filled 2015-04-24 (×4): qty 3

## 2015-04-24 MED ORDER — ARTIFICIAL TEARS OP OINT
TOPICAL_OINTMENT | Freq: Three times a day (TID) | OPHTHALMIC | Status: DC
  Administered 2015-04-24 – 2015-04-26 (×5): via OPHTHALMIC
  Administered 2015-04-26: 1 via OPHTHALMIC
  Administered 2015-04-26 – 2015-04-28 (×5): via OPHTHALMIC
  Administered 2015-04-28: 1 via OPHTHALMIC
  Administered 2015-04-28 – 2015-04-29 (×4): via OPHTHALMIC
  Administered 2015-04-30: 1 via OPHTHALMIC
  Filled 2015-04-24: qty 3.5

## 2015-04-24 MED ORDER — PNEUMOCOCCAL VAC POLYVALENT 25 MCG/0.5ML IJ INJ
0.5000 mL | INJECTION | INTRAMUSCULAR | Status: AC
  Administered 2015-04-25: 0.5 mL via INTRAMUSCULAR
  Filled 2015-04-24: qty 0.5

## 2015-04-24 MED ORDER — TETRACAINE HCL 0.5 % OP SOLN
OPHTHALMIC | Status: AC
  Filled 2015-04-24: qty 2

## 2015-04-24 MED ORDER — HEPARIN SODIUM (PORCINE) 5000 UNIT/ML IJ SOLN
5000.0000 [IU] | Freq: Three times a day (TID) | INTRAMUSCULAR | Status: DC
  Administered 2015-04-24 – 2015-04-30 (×18): 5000 [IU] via SUBCUTANEOUS
  Filled 2015-04-24 (×18): qty 1

## 2015-04-24 MED ORDER — PIPERACILLIN-TAZOBACTAM 3.375 G IVPB
3.3750 g | Freq: Three times a day (TID) | INTRAVENOUS | Status: DC
  Administered 2015-04-24 – 2015-04-29 (×14): 3.375 g via INTRAVENOUS
  Filled 2015-04-24 (×18): qty 50

## 2015-04-24 NOTE — Consult Note (Signed)
CC: Orbital Cellulitis OS  HPI: Nicholas Bell is a 54 y.o. male w/ POH of RE and PMH notable for DM who presents for admission for left orbital cellulitis. Two days ago left eye swelled shut. Symptoms of SOB and sinus problems started 2 weeks ago. Headache and then worsened. Tried OTC sinus medications w/o improvement. Subjective fever/chills. Poorly controlled diabetic. No inciting trauma.    ROS: Neuro: + headache, neg weakness/numbness Psych: no depression, no suicidal ideation Skin: no tattoos, left periorbital edema and erythema Cardiac: no palpitations, no chest pain Pulm: + SOB, nasal congestion + Gen:+ fever/chills, no unintentional weight loss Abd: neg abdnominal pain, no melena/hematochezia MSK: no joint pain, no joint swelling  PMH: Past Medical History  Diagnosis Date  . Diabetes mellitus    PSH: History reviewed. No pertinent past surgical history.  Meds: No current facility-administered medications on file prior to encounter.   Current Outpatient Prescriptions on File Prior to Encounter  Medication Sig Dispense Refill  . ibuprofen (ADVIL,MOTRIN) 200 MG tablet Take 600 mg by mouth once as needed for mild pain or moderate pain.      FH:  History reviewed. No pertinent family history.  SH: Social History  Substance Use Topics  . Smoking status: Passive Smoke Exposure - Never Smoker    Types: Cigarettes  . Smokeless tobacco: None  . Alcohol Use: No    Exam:  VAN: 5 pt +2.50 lens OU - OS blurry  CVF: full OU  EOM: full motility OD, mild limitation of abduction otherwise grossly full  Pupils: 2.5 to 2.0 mm - no APD OU  IOP: 19 OD and 29 OS  Pen-light  External: OD: no abnormal proptosis or erythema OS: 2+ periorbital edema w/ erythema, tight appearing, inf chemosis noted, slight proptosis  Hertel: 17/19 w/ 2 mm proptosis OS  Lid/Lashes: OD: WNL OS: mucous mattering, lid edema and erythema  C/S: OD: W/Q OS: 2-3+ chemosis 360  K: OD:  clear OS: clear  A/C: OD: formed and quiet appearing by penlight OS: formed and quiet appearing by penlight  I: OD: round and regular OS: round and regular  L: OD: 1+ NSC OS: 1+ NSC  V: OD: clear OS: clear  D: OD: C/D 0.4, no disc edema OS: C/D 0.4, no disc edema  M: OD: flat, no obvious diabetic macular edema OS: flat, no obvious diabetic macular edema  V: OD: WNL OS: WNL  P: OD: flat w/o obvious diabetic retinopathy OS: flat w/o obvious diabetic retinopathy  A/P:  1. Left Orbital Cellulitis c/b Poorly Controlled DM: - Recommend at least 48 hours of IV Antibiotics per primary team or infectious disease  - Microbiology typically mixed w/ GP, GN and anaerobic in this age group  - Given poorly controlled diabetic would be concerned for Pseudomonas  - Mucormycosis is still on differential but no obvious vasculitis and no evidence of DKA - Would not recommend IV steroids until improvement in clinical signs - Review of CT scans w/o obvious abscess thus no indication for surgical management and no significant sinusitis requiring ENT consult - Discussed with patient that changes are concerning for orbital cellulitis which if infection not well controlled could lead to irreversible vision loss  - Provided reassurance that at present vision still remains good - Will follow daily until improvement  2. DM: - No obvious evidence of diabetic retinopathy bilaterally  3. Chemosis OS: - Recommend aggressive lubrication w/ artificial tear ointment such as Lubrifresh or Refresh PM TID left  eye  4. Ocular Hypertension OS: - IOP 29 - can continue to monitor as no significant cupping and IOP elevation from tight orbit  Christopher T. Sherryll BurgerShah, MD [facility] Office: [phone]

## 2015-04-24 NOTE — H&P (Signed)
[facility] History and Physical  JASHER BARKAN ZOX:[id number] DOB: [birth year] DOA: 04/24/2015  Referring physician: Emergency Department PCP: No PCP Per Patient  Specialists:   Chief Complaint: Eye swelling  HPI: Nicholas Bell is a 54 y.o. male with a hx of DM, HTN, who presents to ED with 1 wk hx of worsening L eye pain and swelling. Symptoms began with sx of mild sinusitis and eye itchiness. Pt reportedly tried multiple OTC sinus meds w/o improvement. Pt reports subjective fevers but was found to be afebrile and with no leukocytosis in the ED. Pt has noted mild vision changes. ED had spoken with Opthalmology who recommends continued abx for now. Hospitalist consulted for consideration for admission.  Pt has been without medications for over 3 months secondary to no longer seeing PCP. Pt had previously been on insulin for DM. Pt denies knowledge of other medical problems.  Of note, Opthalmology was formally consulted in the ED to be seen on transfer to [facility]  Review of Systems:  Review of Systems  Constitutional: Negative for fever and chills.  HENT: Negative for ear discharge and hearing loss.   Eyes: Positive for double vision, pain and discharge. Negative for photophobia.  Respiratory: Negative for shortness of breath and wheezing.   Cardiovascular: Negative for chest pain and palpitations.  Gastrointestinal: Negative for abdominal pain and blood in stool.  Genitourinary: Negative for hematuria and flank pain.  Musculoskeletal: Negative for back pain, joint pain and neck pain.  Neurological: Negative for tremors, seizures and loss of consciousness.  Psychiatric/Behavioral: Negative for hallucinations and memory loss.     Past Medical History  Diagnosis Date  . Diabetes mellitus    History reviewed. No pertinent past surgical history. Social History:  reports that he has been passively smoking Cigarettes.  He does not have any smokeless tobacco history on file. He reports  that he does not drink alcohol or use illicit drugs.  where does patient live--home, ALF, SNF? and with whom if at home?  Can patient participate in ADLs?  No Known Allergies  No family history on file. Diabetes, HTN  Prior to Admission medications   Medication Sig Start Date End Date Taking? Authorizing Provider  acetaminophen (TYLENOL) 500 MG tablet Take 1,000 mg by mouth every 6 (six) hours as needed for moderate pain.   Yes Historical Provider, MD  Cromolyn Sodium (NASAL ALLERGY NA) Place 2 sprays into the nose daily as needed (allergies).   Yes Historical Provider, MD  diphenhydrAMINE (BENADRYL) 25 MG tablet Take 50 mg by mouth every 6 (six) hours as needed for allergies.   Yes Historical Provider, MD  ibuprofen (ADVIL,MOTRIN) 200 MG tablet Take 600 mg by mouth once as needed for mild pain or moderate pain.   Yes Historical Provider, MD   Physical Exam: Filed Vitals:   04/24/15 1000 04/24/15 1100 04/24/15 1115 04/24/15 1137  BP: 188/102 139/76  178/99  Pulse: 97 96 96 99  Temp:    99.9 F (37.7 C)  TempSrc:    Oral  Resp: Height:      Weight:      SpO2: 99% 99% 98% 98%     General:  Awake, in nad  Eyes: PERRL B, marked L eye edema, redness, tenderness  ENT: membranes moist, dentition fair  Neck: trachea midline, neck supple  Cardiovascular: regular, s1, s2  Respiratory: normal resp effort, no wheezing  Abdomen: soft, nondistended  Skin: normal skin turgor, no abnormal skin lesions  seen  Musculoskeletal: perfused, no clubbing  Psychiatric: mood/affect normal// no auditory/visual hallucinations  Neurologic: cn2-12 grossly intact, strength/sensation intact  Labs on Admission:  Basic Metabolic Panel:  Recent Labs Lab 04/24/15 0901  NA 136  K 4.7  CL 95*  CO2 29  GLUCOSE 370*  BUN 13  CREATININE 1.05  CALCIUM 9.5   Liver Function Tests: No results for input(s): AST, ALT, ALKPHOS, BILITOT, PROT, ALBUMIN in the last 168 hours. No results  for input(s): LIPASE, AMYLASE in the last 168 hours. No results for input(s): AMMONIA in the last 168 hours. CBC:  Recent Labs Lab 04/24/15 0901  WBC 9.3  HGB 15.2  HCT 45.2  MCV 83.2  PLT 173   Cardiac Enzymes: No results for input(s): CKTOTAL, CKMB, CKMBINDEX, TROPONINI in the last 168 hours.  BNP (last 3 results) No results for input(s): BNP in the last 8760 hours.  ProBNP (last 3 results) No results for input(s): PROBNP in the last 8760 hours.  CBG:  Recent Labs Lab 04/24/15 0928  GLUCAP 368*    Radiological Exams on Admission: Ct Orbits W/cm  04/24/2015  CLINICAL DATA:  Orbital cellulitis. Swelling, redness, and left eye drainage for 1 week. Generalized mal lays, headache, and congestion for 2 weeks. EXAM: CT ORBITS WITH CONTRAST TECHNIQUE: Multidetector CT imaging of the orbits was performed following the bolus administration of intravenous contrast. CONTRAST:  75mL OMNIPAQUE IOHEXOL 300 MG/ML  SOLN COMPARISON:  None. FINDINGS: There is moderate left periorbital preseptal soft tissue swelling extending both superior and inferior to the orbit as well as laterally into the face. There is mild left-sided proptosis. A small amount of low density anterior to the left globe may reflect a small amount of fluid underneath the eyelid. No organized, focal fluid collection is identified. There is mild stranding of the intraconal fat on the left. No retrobulbar mass or fluid collection is identified. No primary extraocular muscle abnormality is identified. The right orbit is unremarkable. Globes appear intact. Mild bilateral frontal and ethmoid sinus mucosal thickening is noted. The mastoid air cells are clear. No fracture is identified. The visualized portion of the brain is unremarkable. IMPRESSION: Moderate left periorbital soft tissue swelling consistent with cellulitis. There is also left proptosis and mild intraconal fat stranding concerning for postseptal cellulitis as well.  Electronically Signed   By: Sebastian AcheAllen  Grady M.D.   On: 04/24/2015 10:55    Assessment/Plan Principal Problem:   Periorbital cellulitis of left eye Active Problems:   Diabetes mellitus type 2, uncontrolled, without complications (HCC)   1. Periorbital cellulitis of L eye 1. Marked periorbital edema with vision changes 2. Unasyn started in ED. Will continue 3. ED has discussed case with Opthalmology with [facility]. [facility] 2. DM2 1. Poorly controlled. Pt had been w/o medications for the past 3 months secondary to no longer seeing PCP 2. Will cont on SSI coverage for now 3. Will check a1c 3. HTN 1. BP currently poorly controlled 2. Will start on lisinopril, titrate as needed 3. Cont on PRN hydralazine 4. DVT prophylaxis 1. Heparin subQ  Code Status: Full Family Communication: Pt in room Disposition Plan: [facility]   CHIU, STEPHEN K [facility] Pager [phone]  If 7PM-7AM, please contact night-coverage [email or web address] Password [email or web address] 04/24/2015, 1:01 PM

## 2015-04-24 NOTE — ED Notes (Signed)
MD at bedside. 

## 2015-04-24 NOTE — ED Notes (Signed)
Pt c/o of general malaise, HA, and congestion x 2 weeks. Pt noted to have LT sided facial edema and drainage from LT eye.

## 2015-04-24 NOTE — ED Provider Notes (Signed)
CSN: [id number]     Arrival date & time 04/24/15  0827 History   First MD Initiated Contact with Patient 04/24/15 [phone]     Chief Complaint  Patient presents with  . Facial Swelling     (Consider location/radiation/quality/duration/timing/severity/associated sxs/prior Treatment) The history is provided by the patient and medical records. No language interpreter was used.     Nicholas Bell is a(n) 54 y.o. male who presents to the ED with cc of eye infection. He has pmh of poorly controlled DM. He has been out of his medications for about 3 months. The patient has had about 2 weeks of worsening eye swelling, pressure, which the patient attributed to a sinus infection. He states that over last 3 days he has had worseining drainage, discharge and pain in the left eye. He can see out of the eye but states that it is blurry. He denies a hx of visual problems or use of corrective lenses. He denies injury to the eye. He denies photophobia. Denies fevers, chills, myalgias, arthralgias. Denies DOE, SOB, chest tightness or pressure, radiation to left arm, jaw or back, or diaphoresis. Denies dysuria, flank pain, suprapubic pain, frequency, urgency, or hematuria. Denies headaches, light headedness, weakness, visual disturbances. Denies abdominal pain, nausea, vomiting, diarrhea or constipation.    Past Medical History  Diagnosis Date  . Diabetes mellitus    History reviewed. No pertinent past surgical history. No family history on file. Social History  Substance Use Topics  . Smoking status: Passive Smoke Exposure - Never Smoker    Types: Cigarettes  . Smokeless tobacco: None  . Alcohol Use: No    Review of Systems  Ten systems reviewed and are negative for acute change, except as noted in the HPI.    Allergies  Review of patient's allergies indicates no known allergies.  Home Medications   Prior to Admission medications   Medication Sig Start Date End Date Taking? Authorizing Provider    enalapril (VASOTEC) 2.5 MG tablet Take 2.5 mg by mouth daily.      Historical Provider, MD  glipiZIDE (GLUCOTROL) 5 MG tablet Take 5 mg by mouth daily.      Historical Provider, MD  ibuprofen (ADVIL,MOTRIN) 200 MG tablet Take 600 mg by mouth once as needed for mild pain or moderate pain.    Historical Provider, MD  insulin aspart (NOVOLOG) 100 UNIT/ML injection Inject 5 Units into the skin 3 (three) times daily with meals.    Historical Provider, MD  Insulin Glargine (TOUJEO SOLOSTAR) 300 UNIT/ML SOPN Inject 20 Units into the skin.    Historical Provider, MD  metFORMIN (GLUCOPHAGE) 500 MG tablet Take 500 mg by mouth daily.      Historical Provider, MD  metFORMIN (GLUCOPHAGE) 500 MG tablet Take 1 tablet (500 mg total) by mouth 2 (two) times daily with a meal. 08/01/13   Bethann BerkshireJoseph Zammit, MD   BP 177/101 mmHg  Pulse 98  Temp(Src) 99.4 F (37.4 C) (Oral)  Resp 16  Ht 5\' 8"  (1.727 m)  Wt 150 lb (68.04 kg)  BMI 22.81 kg/m2  SpO2 100% Physical Exam  Constitutional: He appears well-developed and well-nourished. No distress.  HENT:  Head: Normocephalic and atraumatic.  Right Ear: External ear normal.  Left Ear: External ear normal.  No necrotic tissue in the nasal passages or the mouth suggestive of Mucormycosis  Eyes: Pupils are equal, round, and reactive to light. Left eye exhibits chemosis, discharge and exudate. Left conjunctiva is injected. No scleral icterus.  Left eye with erythematous and grossly edematous lid, thick discharge and mattering. There is moderate proptosis and chemosis.  Pressure: OD: 21 OS 30  Neck: Normal range of motion. Neck supple.  Cardiovascular: Normal rate, regular rhythm and normal heart sounds.   Pulmonary/Chest: Effort normal and breath sounds normal. No respiratory distress.  Abdominal: Soft. There is no tenderness.  Musculoskeletal: He exhibits no edema.  Neurological: He is alert.  Skin: Skin is warm and dry. He is not diaphoretic.  Psychiatric: His  behavior is normal.  Nursing note and vitals reviewed.       ED Course  Procedures (including critical care time) Labs Review Labs Reviewed  BASIC METABOLIC PANEL - Abnormal; Notable for the following:    Chloride 95 (*)    Glucose, Bld 370 (*)    All other components within normal limits  CBG MONITORING, ED - Abnormal; Notable for the following:    Glucose-Capillary 368 (*)    All other components within normal limits  WOUND CULTURE  CBC  GC/CHLAMYDIA PROBE AMP ([place]) NOT AT [facility]    Imaging Review Ct Orbits W/cm  04/24/2015  CLINICAL DATA:  Orbital cellulitis. Swelling, redness, and left eye drainage for 1 week. Generalized mal lays, headache, and congestion for 2 weeks. EXAM: CT ORBITS WITH CONTRAST TECHNIQUE: Multidetector CT imaging of the orbits was performed following the bolus administration of intravenous contrast. CONTRAST:  75mL OMNIPAQUE IOHEXOL 300 MG/ML  SOLN COMPARISON:  None. FINDINGS: There is moderate left periorbital preseptal soft tissue swelling extending both superior and inferior to the orbit as well as laterally into the face. There is mild left-sided proptosis. A small amount of low density anterior to the left globe may reflect a small amount of fluid underneath the eyelid. No organized, focal fluid collection is identified. There is mild stranding of the intraconal fat on the left. No retrobulbar mass or fluid collection is identified. No primary extraocular muscle abnormality is identified. The right orbit is unremarkable. Globes appear intact. Mild bilateral frontal and ethmoid sinus mucosal thickening is noted. The mastoid air cells are clear. No fracture is identified. The visualized portion of the brain is unremarkable. IMPRESSION: Moderate left periorbital soft tissue swelling consistent with cellulitis. There is also left proptosis and mild intraconal fat stranding concerning for postseptal cellulitis as well. Electronically Signed   By: Sebastian Ache  M.D.   On: 04/24/2015 10:55   I have personally reviewed and evaluated these images and lab results as part of my medical decision-making.   EKG Interpretation None      MDM   Final diagnoses:  Orbital cellulitis, left    Patient with Proptosis, orbital cellulitis. Patient seen in shared visit with attending physician. Patient begun on IV Unasyn. Pain medications given. I have discussed the case with Dr. Sherryll Burger. The patient will be admitted to [facility]. Dr. Clelia Croft will consult on the patient at the hospital. He has elevated intraocular pressures. No necrotic tissues on examination suggestive of mucormycosis. +hyperglycemia   Pt stable in ED with no significant deterioration in condition. He appears safe for transfer at this time.    Arthor Captain, PA-C 04/24/15 1641  Lorre Nick, MD 08/05/15 1520

## 2015-04-24 NOTE — ED Provider Notes (Signed)
Medical screening examination/treatment/procedure(s) were conducted as a shared visit with non-physician practitioner(s) and myself.  I personally evaluated the patient during the encounter.   EKG Interpretation None     Patient here with 2 weeks of congestion in his face as well as left sided facial swelling localized to his orbit. Endorses subjective fever at home. On exam patient has marked periorbital edema and ecchymosis to the left eye. Obtain CT scan of maxillofacial and blood work is pending  Lorre NickAnthony Manuelito Poage, MD 04/24/15 [phone]

## 2015-04-24 NOTE — ED Notes (Signed)
Report given to Rand[facility]inzain[facility] at [facility] for [phone].

## 2015-04-24 NOTE — Progress Notes (Addendum)
ANTIBIOTIC CONSULT NOTE - INITIAL  Pharmacy Consult for Unasyn to Zosyn/Vancomycin Indication: cellulitis  No Known Allergies  Patient Measurements: Height: 5\' 8"  (172.7 cm) Weight: 150 lb (68.04 kg) IBW/kg (Calculated) : 68.4    Labs:  Recent Labs  04/24/15 0901  WBC 9.3  HGB 15.2  PLT 173  CREATININE 1.05   Estimated Creatinine Clearance: 76.5 mL/min (by C-G formula based on Cr of 1.05). No results for input(s): VANCOTROUGH, VANCOPEAK, VANCORANDOM, GENTTROUGH, GENTPEAK, GENTRANDOM, TOBRATROUGH, TOBRAPEAK, TOBRARND, AMIKACINPEAK, AMIKACINTROU, AMIKACIN in the last 72 hours.   Microbiology: No results found for this or any previous visit (from the past 720 hour(s)).  Medical History: Past Medical History  Diagnosis Date  . Diabetes mellitus     Assessment: 54 yo man to transition to Zosyn and Vancomycin for orbital cellulitis  Goal of Therapy:  Appropriate Zosyn dosing Vancomycin trough = 10 to 15 mcg / ml  Plan:  Zosyn 3.375 grams iv Q 8 hours - 4 hr infusion Vancomycin 750 mg iv Q 12 hours Follow up Scr, progress, cultures  Thank you Okey RegalLisa Bayli Quesinberry, PharmD [phone]

## 2015-04-25 LAB — GLUCOSE, CAPILLARY
GLUCOSE-CAPILLARY: 119 mg/dL — AB (ref 65–99)
GLUCOSE-CAPILLARY: 180 mg/dL — AB (ref 65–99)
Glucose-Capillary: 311 mg/dL — ABNORMAL HIGH (ref 65–99)
Glucose-Capillary: 279 mg/dL — ABNORMAL HIGH (ref 65–99)

## 2015-04-25 LAB — CBC
HEMATOCRIT: 40.2 % (ref 39.0–52.0)
Hemoglobin: 12.9 g/dL — ABNORMAL LOW (ref 13.0–17.0)
MCH: 26.8 pg (ref 26.0–34.0)
MCHC: 32.1 g/dL (ref 30.0–36.0)
MCV: 83.6 fL (ref 78.0–100.0)
Platelets: 171 10*3/uL (ref 150–400)
RBC: 4.81 MIL/uL (ref 4.22–5.81)
RDW: 12.4 % (ref 11.5–15.5)
WBC: 13.4 10*3/uL — AB (ref 4.0–10.5)

## 2015-04-25 LAB — COMPREHENSIVE METABOLIC PANEL
ALT: 15 U/L — ABNORMAL LOW (ref 17–63)
AST: 19 U/L (ref 15–41)
Albumin: 2.5 g/dL — ABNORMAL LOW (ref 3.5–5.0)
Alkaline Phosphatase: 80 U/L (ref 38–126)
Anion gap: 12 (ref 5–15)
BUN: 12 mg/dL (ref 6–20)
CHLORIDE: 97 mmol/L — AB (ref 101–111)
CO2: 25 mmol/L (ref 22–32)
Calcium: 8.8 mg/dL — ABNORMAL LOW (ref 8.9–10.3)
Creatinine, Ser: 1.08 mg/dL (ref 0.61–1.24)
Glucose, Bld: 273 mg/dL — ABNORMAL HIGH (ref 65–99)
POTASSIUM: 4.5 mmol/L (ref 3.5–5.1)
SODIUM: 134 mmol/L — AB (ref 135–145)
Total Bilirubin: 1.1 mg/dL (ref 0.3–1.2)
Total Protein: 7 g/dL (ref 6.5–8.1)

## 2015-04-25 LAB — HEMOGLOBIN A1C
HEMOGLOBIN A1C: 13.7 % — AB (ref 4.8–5.6)
MEAN PLASMA GLUCOSE: 346 mg/dL

## 2015-04-25 MED ORDER — HYDROCODONE-ACETAMINOPHEN 5-325 MG PO TABS
1.0000 | ORAL_TABLET | Freq: Four times a day (QID) | ORAL | Status: DC | PRN
  Administered 2015-04-25 – 2015-04-28 (×3): 2 via ORAL
  Filled 2015-04-25 (×4): qty 2

## 2015-04-25 MED ORDER — INSULIN DETEMIR 100 UNIT/ML ~~LOC~~ SOLN
12.0000 [IU] | Freq: Every day | SUBCUTANEOUS | Status: DC
  Administered 2015-04-25: 12 [IU] via SUBCUTANEOUS
  Filled 2015-04-25 (×2): qty 0.12

## 2015-04-25 MED ORDER — INSULIN ASPART 100 UNIT/ML ~~LOC~~ SOLN
10.0000 [IU] | Freq: Three times a day (TID) | SUBCUTANEOUS | Status: DC
  Administered 2015-04-25 – 2015-04-26 (×3): 10 [IU] via SUBCUTANEOUS

## 2015-04-25 NOTE — Consult Note (Signed)
S: Subjectively feels pain and swelling maybe slightly improved. Switched to [employer]anc/Zosyn Abx.   O: Labs: CBC w/ leukocytosis this AM. Wound Cx: abundant GPR and moderate GPC, few GNR   External still w/ significant periorbital edema and erythema OS - appears same or slightly improved  VAN: OD: 3 pt cc +2.50  OS: 26 pt cc + 2.50 (blurry - ointment in eye)  Pupils: OD: 2.5 to 2 mm - no APD OS: 2.5 mm - minimally reactive ? Trace APD  Motility: OD: full OS: mild limitation abduction  Hertel: 2 mm proptosis OS - same  IOP: 14/23 - Tonopen  Pen Light Exam: Lid/Lashes: OD: WNL OS: mucous mattering, lid edema and erythema  C/S: OD: W/Q OS: 2-3+ chemosis 360  K: OD: clear OS: clear  A/C: OD: formed and quiet appearing by penlight OS: formed and quiet appearing by penlight  I: OD: round and regular OS: round and regular  L: OD: 1+ NSC OS: 1+ NSC   A/P:  1. Left Orbital Cellulitis c/b Poorly Controlled DM: - Continue current management w/ Vanc and Zosyn  - Possible APD and possible worsening vision although may be from ointment  - Discussed w/ patient likely will have residual vision impairment from infection but unclear to what extent  - Discussed at this time no indication for surgery  - Discussed that will need time for IV antibiotics to work and for swelling to improve - Would have low threshold for repeat CT orbit w/ contrast tomorrow AM if swelling not significantly improved to reassess if fat stranding has progressed to actual abscess - Will re-evaluate tomorrow afternoon    2. Chemosis OS: - Recommend aggressive lubrication w/ artificial tear ointment such as Lubrifresh or Refresh PM TID left eye  3. Ocular Hypertension OS: - IOP 23 down from 29 - may be indirect sign periorbital edema improving and less tight orbit  Georges Mousehristopher Nathaniel Wakeley, MD [facility] Office: [phone]

## 2015-04-25 NOTE — Progress Notes (Signed)
[facility] PROGRESS NOTE  Christene LyeKim L Nath WUJ:[id number] DOB: [birth year] DOA: 04/24/2015 PCP: No PCP Per Patient  Assessment/Plan:  Principal Problem: orbital cellulitis of left eye: Continue vancomycin and Zosyn. Per ophthalmology, if worse or no better, would repeat CT orbit tomorrow. If surgery required, would need [facility]. Active Problems:   Diabetes mellitus type 2, uncontrolled, without complications (HCC): Lost his insurance and stopped taking medications. He was on Trujeo 12 units nightly previously. Also NovoLog 10 with meals. Will resume NovoLog and add Levemir 12 units nightly.  Code Status:  full Family Communication:  Pt is lucid Disposition Plan:     HPI/Subjective: Pain swelling and drainage about the same. Wants to get back on insulin therapy at home  Objective: Filed Vitals:   04/25/15 0440  BP: 170/90  Pulse: 103  Temp: 99.3 F (37.4 C)  Resp: 18    Intake/Output Summary (Last 24 hours) at 04/25/15 1120 Last data filed at 04/25/15 1100  Gross per 24 hour  Intake 1718.75 ml  Output   1950 ml  Net -231.25 ml   Filed Weights   04/24/15 0834  Weight: 68.04 kg (150 lb)    Exam:   General:  Ill appearing  Heent: left periorbital swelling with purulent drainage and left facial swelling  Cardiovascular: RRR without MGR  Respiratory: CTA without WRR  Abdomen: S, NT, ND  Ext: no CCE  Basic Metabolic Panel:  Recent Labs Lab 04/24/15 0901 04/25/15 0400  NA 136 134*  K 4.7 4.5  CL 95* 97*  CO2 29 25  GLUCOSE 370* 273*  BUN 13 12  CREATININE 1.05 1.08  CALCIUM 9.5 8.8*   Liver Function Tests:  Recent Labs Lab 04/25/15 0400  AST 19  ALT 15*  ALKPHOS 80  BILITOT 1.1  PROT 7.0  ALBUMIN 2.5*   No results for input(s): LIPASE, AMYLASE in the last 168 hours. No results for input(s): AMMONIA in the last 168 hours. CBC:  Recent Labs Lab 04/24/15 0901 04/25/15 0400  WBC 9.3 13.4*  HGB 15.2 12.9*  HCT 45.2 40.2   MCV 83.2 83.6  PLT 173 171   Cardiac Enzymes: No results for input(s): CKTOTAL, CKMB, CKMBINDEX, TROPONINI in the last 168 hours. BNP (last 3 results) No results for input(s): BNP in the last 8760 hours.  ProBNP (last 3 results) No results for input(s): PROBNP in the last 8760 hours.  CBG:  Recent Labs Lab 04/24/15 0928 04/24/15 1731 04/24/15 2154 04/25/15 0804  GLUCAP 368* 287* 226* 311*    Recent Results (from the past 240 hour(s))  Wound culture     Status: None (Preliminary result)   Collection Time: 04/24/15  9:22 AM  Result Value Ref Range Status   Specimen Description OTHER  Final   Special Requests NONE  Final   Gram Stain   Final    FEW WBC PRESENT,BOTH PMN AND MONONUCLEAR NO SQUAMOUS EPITHELIAL CELLS SEEN ABUNDANT GRAM POSITIVE RODS MODERATE GRAM POSITIVE COCCI IN PAIRS FEW GRAM NEGATIVE RODS    Culture PENDING  Incomplete   Report Status PENDING  Incomplete  Culture, blood (routine x 2)     Status: None (Preliminary result)   Collection Time: 04/24/15  1:30 PM  Result Value Ref Range Status   Specimen Description LEFT ANTECUBITAL  Final   Special Requests BOTTLES DRAWN AEROBIC AND ANAEROBIC 8CC EACH  Final   Culture NO GROWTH < 24 HOURS  Final   Report Status PENDING  Incomplete  Culture, blood (  routine x 2)     Status: None (Preliminary result)   Collection Time: 04/24/15  1:35 PM  Result Value Ref Range Status   Specimen Description BLOOD LEFT HAND  Final   Special Requests BOTTLES DRAWN AEROBIC AND ANAEROBIC 6CC EACH  Final   Culture NO GROWTH < 24 HOURS  Final   Report Status PENDING  Incomplete     Studies: Ct Orbits W/cm  04/24/2015  CLINICAL DATA:  Orbital cellulitis. Swelling, redness, and left eye drainage for 1 week. Generalized mal lays, headache, and congestion for 2 weeks. EXAM: CT ORBITS WITH CONTRAST TECHNIQUE: Multidetector CT imaging of the orbits was performed following the bolus administration of intravenous contrast. CONTRAST:   75mL OMNIPAQUE IOHEXOL 300 MG/ML  SOLN COMPARISON:  None. FINDINGS: There is moderate left periorbital preseptal soft tissue swelling extending both superior and inferior to the orbit as well as laterally into the face. There is mild left-sided proptosis. A small amount of low density anterior to the left globe may reflect a small amount of fluid underneath the eyelid. No organized, focal fluid collection is identified. There is mild stranding of the intraconal fat on the left. No retrobulbar mass or fluid collection is identified. No primary extraocular muscle abnormality is identified. The right orbit is unremarkable. Globes appear intact. Mild bilateral frontal and ethmoid sinus mucosal thickening is noted. The mastoid air cells are clear. No fracture is identified. The visualized portion of the brain is unremarkable. IMPRESSION: Moderate left periorbital soft tissue swelling consistent with cellulitis. There is also left proptosis and mild intraconal fat stranding concerning for postseptal cellulitis as well. Electronically Signed   By: Sebastian Ache M.D.   On: 04/24/2015 10:55    Scheduled Meds: . artificial tears   Left Eye TID  . heparin  5,000 Units Subcutaneous 3 times per day  . insulin aspart  0-15 Units Subcutaneous TID WC  . lisinopril  5 mg Oral Daily  . piperacillin-tazobactam (ZOSYN)  IV  3.375 g Intravenous Q8H  . vancomycin  750 mg Intravenous Q12H   Continuous Infusions: . sodium chloride 75 mL/hr at 04/25/15 0559    Time spent: 25 minutes  Syriah Delisi L  [facility] [email or web address], password [facility] 04/25/2015, 11:20 AM  LOS: 1 day

## 2015-04-26 ENCOUNTER — Inpatient Hospital Stay (HOSPITAL_COMMUNITY): Payer: Self-pay

## 2015-04-26 LAB — GLUCOSE, CAPILLARY
GLUCOSE-CAPILLARY: 227 mg/dL — AB (ref 65–99)
GLUCOSE-CAPILLARY: 153 mg/dL — AB (ref 65–99)
GLUCOSE-CAPILLARY: 144 mg/dL — AB (ref 65–99)

## 2015-04-26 MED ORDER — IBUPROFEN 400 MG PO TABS
400.0000 mg | ORAL_TABLET | Freq: Four times a day (QID) | ORAL | Status: DC | PRN
  Administered 2015-04-26 – 2015-04-27 (×4): 400 mg via ORAL
  Filled 2015-04-26 (×4): qty 1

## 2015-04-26 MED ORDER — INSULIN DETEMIR 100 UNIT/ML ~~LOC~~ SOLN
15.0000 [IU] | Freq: Every day | SUBCUTANEOUS | Status: DC
  Administered 2015-04-26 – 2015-04-27 (×2): 15 [IU] via SUBCUTANEOUS
  Filled 2015-04-26 (×3): qty 0.15

## 2015-04-26 MED ORDER — IOHEXOL 300 MG/ML  SOLN
100.0000 mL | Freq: Once | INTRAMUSCULAR | Status: AC | PRN
  Administered 2015-04-26: 100 mL via INTRAVENOUS

## 2015-04-26 MED ORDER — INSULIN ASPART 100 UNIT/ML ~~LOC~~ SOLN
12.0000 [IU] | Freq: Three times a day (TID) | SUBCUTANEOUS | Status: DC
  Administered 2015-04-26 – 2015-04-28 (×5): 12 [IU] via SUBCUTANEOUS

## 2015-04-26 NOTE — Consult Note (Signed)
S: Afebrile. Subjectively feels pain and swelling maybe slightly improved. Continued on Vanc/Zosyn Abx.   O: Labs: no CBC for today Wound Cx: abundant GPR and moderate GPC, few GNR - report pending  External still w/ significant periorbital edema and erythema OS - appears same but feels less tense  VAN: OD: 4 pt cc +2.50  OS: 26 pt cc + 2.50 (blurry - ointment in eye)  Pupils: OD: 2.5 to 2 mm - no APD OS: 2.5 mm - minimally reactive - likely APD but difficult to assess given periorbital edema  Motility: OD: full OS: mild limitation abduction  Hertel: 2 mm proptosis OS - stable, difficult to measure  IOP: 21/25 - Tonopen  Pen Light Exam: Lid/Lashes: OD: WNL OS: mucous mattering, lid edema and erythema  C/S: OD: W/Q OS: 2-3+ chemosis 360  K: OD: clear OS: clear  A/C: OD: formed and quiet appearing by penlight OS: formed and quiet appearing by penlight  I: OD: round and regular OS: round and regular  L: OD: 1+ NSC OS: 1+ NSC   A/P:  1. Left Orbital Cellulitis c/b Poorly Controlled DM: - Continue current management w/ IV Vanc and Zosyn - would not recommend switching to PO antibiotic yet - Recommend repeat CT Orbit w/ contrast as not improving as much as would be expected to evaluate for possible interval development of abscess   - If abscess noted on CT scan would [facility] w[facility] Oculoplastic coverage for surgical drainage - Will continue to follow daily    2. Chemosis OS: - Recommend aggressive lubrication w/ artificial tear ointment such as Lubrifresh or Refresh PM TID left eye  3. Ocular Hypertension OS: - IOP 25 - continue to monitor on no drops  Georges Mousehristopher Fynn Vanblarcom, MD [facility] Office: [phone]

## 2015-04-26 NOTE — Progress Notes (Signed)
[facility] PROGRESS NOTE  Nicholas Bell [id number] DOB: Oct 14, 1960 DOA: 04/24/2015 PCP: No PCP Per Patient  Assessment/Plan:  Principal Problem: orbital cellulitis of left eye: Continue vancomycin and Zosyn. Swelling remains, but slightly improved today.  Hold off on repeat CT  Active Problems:   Diabetes mellitus type 2, uncontrolled, without complications (HCC): better but not at goal. Will increase levemir to 15 units. Increase novolog 12 qac.  Code Status:  full Family Communication:  Pt is lucid Disposition Plan:     HPI/Subjective: Asking for ibuprofen  Objective: Filed Vitals:   04/26/15 0537  BP: 157/91  Pulse: 90  Temp: 99.1 F (37.3 C)  Resp: 17    Intake/Output Summary (Last 24 hours) at 04/26/15 0945 Last data filed at 04/26/15 0843  Gross per 24 hour  Intake      0 ml  Output   1700 ml  Net  -1700 ml   Filed Weights   04/24/15 0834  Weight: 68.04 kg (150 lb)    Exam:   General:  Asleep. arousable  Heent: left periorbital swelling and left facial swelling slightly improved with less purulent drainage  Cardiovascular: RRR without MGR  Respiratory: CTA without WRR  Abdomen: S, NT, ND  Ext: no CCE  Basic Metabolic Panel:  Recent Labs Lab 04/24/15 0901 04/25/15 0400  NA 136 134*  K 4.7 4.5  CL 95* 97*  CO2 29 25  GLUCOSE 370* 273*  BUN 13 12  CREATININE 1.05 1.08  CALCIUM 9.5 8.8*   Liver Function Tests:  Recent Labs Lab 04/25/15 0400  AST 19  ALT 15*  ALKPHOS 80  BILITOT 1.1  PROT 7.0  ALBUMIN 2.5*   No results for input(s): LIPASE, AMYLASE in the last 168 hours. No results for input(s): AMMONIA in the last 168 hours. CBC:  Recent Labs Lab 04/24/15 0901 04/25/15 0400  WBC 9.3 13.4*  HGB 15.2 12.9*  HCT 45.2 40.2  MCV 83.2 83.6  PLT 173 171   Cardiac Enzymes: No results for input(s): CKTOTAL, CKMB, CKMBINDEX, TROPONINI in the last 168 hours. BNP (last 3 results) No results for input(s): BNP in  the last 8760 hours.  ProBNP (last 3 results) No results for input(s): PROBNP in the last 8760 hours.  CBG:  Recent Labs Lab 04/25/15 0804 04/25/15 1227 04/25/15 1714 04/25/15 2118 04/26/15 0803  GLUCAP 311* 279* 119* 180* 227*    Recent Results (from the past 240 hour(s))  Wound culture     Status: None (Preliminary result)   Collection Time: 04/24/15  9:22 AM  Result Value Ref Range Status   Specimen Description OTHER  Final   Special Requests NONE  Final   Gram Stain   Final    FEW WBC PRESENT,BOTH PMN AND MONONUCLEAR NO SQUAMOUS EPITHELIAL CELLS SEEN ABUNDANT GRAM POSITIVE RODS MODERATE GRAM POSITIVE COCCI IN PAIRS FEW GRAM NEGATIVE RODS    Culture   Final    Culture reincubated for better growth Performed at [facility]    Report Status PENDING  Incomplete  Culture, blood (routine x 2)     Status: None (Preliminary result)   Collection Time: 04/24/15  1:30 PM  Result Value Ref Range Status   Specimen Description LEFT ANTECUBITAL  Final   Special Requests BOTTLES DRAWN AEROBIC AND ANAEROBIC 8CC EACH  Final   Culture NO GROWTH 2 DAYS  Final   Report Status PENDING  Incomplete  Culture, blood (routine x 2)     Status: None (  Preliminary result)   Collection Time: 04/24/15  1:35 PM  Result Value Ref Range Status   Specimen Description BLOOD LEFT HAND  Final   Special Requests BOTTLES DRAWN AEROBIC AND ANAEROBIC [facility] EACH  Final   Culture NO GROWTH 2 DAYS  Final   Report Status PENDING  Incomplete     Studies: Ct Orbits W/cm  04/24/2015  CLINICAL DATA:  Orbital cellulitis. Swelling, redness, and left eye drainage for 1 week. Generalized mal lays, headache, and congestion for 2 weeks. EXAM: CT ORBITS WITH CONTRAST TECHNIQUE: Multidetector CT imaging of the orbits was performed following the bolus administration of intravenous contrast. CONTRAST:  75mL OMNIPAQUE IOHEXOL 300 MG/ML  SOLN COMPARISON:  None. FINDINGS: There is moderate left periorbital preseptal  soft tissue swelling extending both superior and inferior to the orbit as well as laterally into the face. There is mild left-sided proptosis. A small amount of low density anterior to the left globe may reflect a small amount of fluid underneath the eyelid. No organized, focal fluid collection is identified. There is mild stranding of the intraconal fat on the left. No retrobulbar mass or fluid collection is identified. No primary extraocular muscle abnormality is identified. The right orbit is unremarkable. Globes appear intact. Mild bilateral frontal and ethmoid sinus mucosal thickening is noted. The mastoid air cells are clear. No fracture is identified. The visualized portion of the brain is unremarkable. IMPRESSION: Moderate left periorbital soft tissue swelling consistent with cellulitis. There is also left proptosis and mild intraconal fat stranding concerning for postseptal cellulitis as well. Electronically Signed   By: Sebastian Ache M.D.   On: 04/24/2015 10:55    Scheduled Meds: . artificial tears   Left Eye TID  . heparin  5,000 Units Subcutaneous 3 times per day  . insulin aspart  0-15 Units Subcutaneous TID WC  . insulin aspart  10 Units Subcutaneous TID WC  . insulin detemir  12 Units Subcutaneous QHS  . lisinopril  5 mg Oral Daily  . piperacillin-tazobactam (ZOSYN)  IV  3.375 g Intravenous Q8H  . vancomycin  750 mg Intravenous Q12H   Continuous Infusions: . sodium chloride 10 mL/hr at 04/25/15 1137    Time spent: 25 minutes  [name] L  [facility] [email or web address], password [facility] 04/26/2015, 9:45 AM  LOS: 2 days

## 2015-04-27 LAB — GC/CHLAMYDIA PROBE AMP (~~LOC~~) NOT AT ARMC
Chlamydia: NEGATIVE
Neisseria Gonorrhea: NEGATIVE

## 2015-04-27 LAB — BASIC METABOLIC PANEL
ANION GAP: 7 (ref 5–15)
BUN: 12 mg/dL (ref 6–20)
CHLORIDE: 99 mmol/L — AB (ref 101–111)
CO2: 30 mmol/L (ref 22–32)
Calcium: 8.6 mg/dL — ABNORMAL LOW (ref 8.9–10.3)
Creatinine, Ser: 0.9 mg/dL (ref 0.61–1.24)
Glucose, Bld: 195 mg/dL — ABNORMAL HIGH (ref 65–99)
POTASSIUM: 3.8 mmol/L (ref 3.5–5.1)
SODIUM: 136 mmol/L (ref 135–145)

## 2015-04-27 LAB — CBC
HEMATOCRIT: 37.5 % — AB (ref 39.0–52.0)
HEMOGLOBIN: 12.3 g/dL — AB (ref 13.0–17.0)
MCH: 27.2 pg (ref 26.0–34.0)
MCHC: 32.8 g/dL (ref 30.0–36.0)
MCV: 82.8 fL (ref 78.0–100.0)
Platelets: 181 10*3/uL (ref 150–400)
RBC: 4.53 MIL/uL (ref 4.22–5.81)
RDW: 12.3 % (ref 11.5–15.5)
WBC: 8.1 10*3/uL (ref 4.0–10.5)

## 2015-04-27 LAB — VANCOMYCIN, TROUGH: VANCOMYCIN TR: 8 ug/mL — AB (ref 10.0–20.0)

## 2015-04-27 LAB — GLUCOSE, CAPILLARY
GLUCOSE-CAPILLARY: 328 mg/dL — AB (ref 65–99)
GLUCOSE-CAPILLARY: 183 mg/dL — AB (ref 65–99)
Glucose-Capillary: 145 mg/dL — ABNORMAL HIGH (ref 65–99)
Glucose-Capillary: 87 mg/dL (ref 65–99)
Glucose-Capillary: 292 mg/dL — ABNORMAL HIGH (ref 65–99)

## 2015-04-27 MED ORDER — VANCOMYCIN HCL 500 MG IV SOLR
500.0000 mg | Freq: Once | INTRAVENOUS | Status: AC
  Administered 2015-04-27: 500 mg via INTRAVENOUS
  Filled 2015-04-27: qty 500

## 2015-04-27 MED ORDER — VANCOMYCIN HCL IN DEXTROSE 1-5 GM/200ML-% IV SOLN
1000.0000 mg | Freq: Two times a day (BID) | INTRAVENOUS | Status: DC
  Administered 2015-04-27 – 2015-04-28 (×3): 1000 mg via INTRAVENOUS
  Filled 2015-04-27 (×5): qty 200

## 2015-04-27 MED ORDER — METHYLPREDNISOLONE SODIUM SUCC 1000 MG IJ SOLR
1000.0000 mg | Freq: Every day | INTRAMUSCULAR | Status: AC
  Administered 2015-04-27 – 2015-04-29 (×3): 1000 mg via INTRAVENOUS
  Filled 2015-04-27 (×6): qty 8

## 2015-04-27 MED ORDER — IBUPROFEN 400 MG PO TABS: 400.0000 mg | ORAL_TABLET | ORAL | Status: DC | PRN

## 2015-04-27 NOTE — Progress Notes (Signed)
Pt. had eaten hs snack prior to CBG being obtained @ 2110 last p.m.

## 2015-04-27 NOTE — Progress Notes (Addendum)
Inpatient Diabetes Program Recommendations  AACE/ADA: New Consensus Statement on Inpatient Glycemic Control (2015)  Target Ranges:  Prepandial:   less than 140 mg/dL      Peak postprandial:   less than 180 mg/dL (1-2 hours)      Critically ill patients:  140 - 180 mg/dL   Review of Glycemic Control:  Results for Nicholas Bell, Nicholas Bell (MRN [id number]) as of 04/27/2015 15:57  Ref. Range 04/26/2015 11:59 04/26/2015 17:18 04/26/2015 21:01 04/27/2015 08:24 04/27/2015 12:23  Glucose-Capillary Latest Ref Range: 65-99 mg/dL 409153 (H) 811144 (H) 914328 (H) 183 (H) 145 (H)  Results for Nicholas Bell, Nicholas Bell (MRN [id number]) as of 04/27/2015 15:57  Ref. Range 04/24/2015 09:01  Hemoglobin A1C Latest Ref Range: 4.8-5.6 % 13.7 (H)    Diabetes history: Type 2 diabetes Outpatient Diabetes medications: None Current orders for Inpatient glycemic control:  Levemir 15 units q HS, Novolog 12 units tid with meals, Novolog moderate tid with meals  Inpatient Diabetes Program Recommendations: Will place order for Case management consult regarding PCP and medications.  No insurance per face sheet.  Blood sugars are better controlled today.  Note that A1C indicates very poor control of diabetes prior to admit, likely because he had stopped taking her insulin for the past 3 months.  Will talk with patient regarding home diabetes management.  Thanks, Beryl MeagerJenny Agastya Meister, RN, BC-ADM Inpatient Diabetes Coordinator Pager [phone] (8a-5p)

## 2015-04-27 NOTE — Consult Note (Signed)
S: Afebrile. Subjectively feels pain and swelling maybe slightly improved. Continued on Vanc/Zosyn Abx. Repeat CT scan w/o new abscess  O: CT scan - no abscess, interval w/o significant cahnge Labs: leukocytosis improved Wound Cx: staph aureus - sensitivities pending   External still w/ significant periorbital edema and erythema OS - appears same but feels less tense  VAN: OD: 4 pt cc +2.50  OS: 26 pt cc + 2.50 (blurry - ointment in eye)  Pupils: OD: 2.5 to 2 mm - no APD OS: 2.5 mm - minimally reactive - likely APD but difficult to assess given periorbital edema  Motility: OD: full OS: mild limitation abduction  Hertel: 2 mm proptosis OS - stable, difficult to measure  IOP: 17/20 - Tonopen  Pen Light Exam: Lid/Lashes: OD: WNL OS: mucous mattering, lid edema and erythema  C/S: OD: W/Q OS: 2-3+ chemosis 360  K: OD: clear OS: clear  A/C: OD: formed and quiet appearing by penlight OS: formed and quiet appearing by penlight  I: OD: round and regular OS: round and regular  L: OD: 1+ NSC OS: 1+ NSC   A/P:  1. Left Orbital Cellulitis c/b Poorly Controlled DM: - Continue current management w/ IV Vanc and Zosyn - would not recommend switching to PO antibiotic until further improvement - Would recommend 1g IV Solumedrol daily for 3 days - Continue to follow daily  2. Chemosis OS: - Recommend aggressive lubrication w/ artificial tear ointment such as Lubrifresh or Refresh PM TID left eye  3. Ocular Hypertension OS: - IOP 20 - continue to monitor on no drops  Disposition: - Transition of ophthalmology care to Dr. Mack HookAndrew Mincey - [phone]   Georges Mousehristopher Tamalyn Wadsworth, MD [facility] Office: [phone]

## 2015-04-27 NOTE — Progress Notes (Signed)
Spoke with patient regarding diabetes management.  He states that he was seeing Dr. Fransico HimNida for his diabetes management but admits that he had stopped taking prescribed insulin.  He states that previously he was taking Toujeo and Novolog which was prescribed by Dr. Fransico HimNida.  He states that he does have insurance from previous job.  Will need follow up with Dr. Fransico HimNida after discharge for diabetes management.  Discussed elevated A1C. Patient appears to feel bad and not engaged in conversation. Will continue to follow.  Thanks, Beryl MeagerJenny Rhya Shan, RN, BC-ADM Inpatient Diabetes Coordinator Pager [phone] (8a-5p)

## 2015-04-27 NOTE — Progress Notes (Signed)
[facility] PROGRESS NOTE  Christene LyeKim L Kolodziej ZOX:[id number] DOB: [birth year] DOA: 04/24/2015 PCP: No PCP Per Patient  Assessment/Plan:  Principal Problem: orbital cellulitis of left eye: Continue vancomycin and Zosyn. Swelling still pretty severe, unchanged from yesterday morning. Repeat CT without abscess. D/w Dr. Sherryll BurgerShah. Add solumedrol 1 gm daily x3 Active Problems:   Diabetes mellitus type 2, uncontrolled, without complications (HCC): improving. Expect rise in CBG on steroids  Code Status:  full Family Communication:  Pt is lucid Disposition Plan:     HPI/Subjective: Pain 7/10  Objective: Filed Vitals:   04/27/15 0523  BP: 155/90  Pulse: 84  Temp: 98.4 F (36.9 C)  Resp: 17    Intake/Output Summary (Last 24 hours) at 04/27/15 1617 Last data filed at 04/27/15 0523  Gross per 24 hour  Intake    400 ml  Output   1600 ml  Net  -1200 ml   Filed Weights   04/24/15 0834  Weight: 68.04 kg (150 lb)    Exam:   General:  Asleep. arousable  Heent: left periorbital swelling and left facial swelling unchanged from 11/13  Cardiovascular: RRR without MGR  Respiratory: CTA without WRR  Abdomen: S, NT, ND  Ext: no CCE  Basic Metabolic Panel:  Recent Labs Lab 04/24/15 0901 04/25/15 0400 04/27/15 0805  NA 136 134* 136  K 4.7 4.5 3.8  CL 95* 97* 99*  CO2 29 25 30   GLUCOSE 370* 273* 195*  BUN 13 12 12   CREATININE 1.05 1.08 0.90  CALCIUM 9.5 8.8* 8.6*   Liver Function Tests:  Recent Labs Lab 04/25/15 0400  AST 19  ALT 15*  ALKPHOS 80  BILITOT 1.1  PROT 7.0  ALBUMIN 2.5*   No results for input(s): LIPASE, AMYLASE in the last 168 hours. No results for input(s): AMMONIA in the last 168 hours. CBC:  Recent Labs Lab 04/24/15 0901 04/25/15 0400 04/27/15 0212  WBC 9.3 13.4* 8.1  HGB 15.2 12.9* 12.3*  HCT 45.2 40.2 37.5*  MCV 83.2 83.6 82.8  PLT 173 171 181   Cardiac Enzymes: No results for input(s): CKTOTAL, CKMB, CKMBINDEX, TROPONINI in the  last 168 hours. BNP (last 3 results) No results for input(s): BNP in the last 8760 hours.  ProBNP (last 3 results) No results for input(s): PROBNP in the last 8760 hours.  CBG:  Recent Labs Lab 04/26/15 1159 04/26/15 1718 04/26/15 2101 04/27/15 0824 04/27/15 1223  GLUCAP 153* 144* 328* 183* 145*    Recent Results (from the past 240 hour(s))  Wound culture     Status: None (Preliminary result)   Collection Time: 04/24/15  9:22 AM  Result Value Ref Range Status   Specimen Description OTHER  Final   Special Requests NONE  Final   Gram Stain   Final    FEW WBC PRESENT,BOTH PMN AND MONONUCLEAR NO SQUAMOUS EPITHELIAL CELLS SEEN ABUNDANT GRAM POSITIVE RODS MODERATE GRAM POSITIVE COCCI IN PAIRS FEW GRAM NEGATIVE RODS    Culture   Final    ABUNDANT STAPHYLOCOCCUS AUREUS Note: RIFAMPIN AND GENTAMICIN SHOULD NOT BE USED AS SINGLE DRUGS FOR TREATMENT OF STAPH INFECTIONS. Performed at [facility]    Report Status PENDING  Incomplete  Culture, blood (routine x 2)     Status: None (Preliminary result)   Collection Time: 04/24/15  1:30 PM  Result Value Ref Range Status   Specimen Description LEFT ANTECUBITAL  Final   Special Requests BOTTLES DRAWN AEROBIC AND ANAEROBIC [facility]8CC EACH  Final   Culture  NO GROWTH 3 DAYS  Final   Report Status PENDING  Incomplete  Culture, blood (routine x 2)     Status: None (Preliminary result)   Collection Time: 04/24/15  1:35 PM  Result Value Ref Range Status   Specimen Description BLOOD LEFT HAND  Final   Special Requests BOTTLES DRAWN AEROBIC AND ANAEROBIC 6CC EACH  Final   Culture NO GROWTH 3 DAYS  Final   Report Status PENDING  Incomplete     Studies: Ct Orbits W/cm  04/26/2015  CLINICAL DATA:  Followup for left orbital and preseptal cellulitis. No improvement in 2 days with antibiotic therapy. EXAM: CT ORBITS WITH CONTRAST TECHNIQUE: Multidetector CT imaging of the orbits was performed following the bolus administration of intravenous  contrast. CONTRAST:  OMNIPAQUE IOHEXOL 300 MG/ML  SOLN COMPARISON:  04/24/2015 FINDINGS: Orbits: Left preseptal periorbital soft tissue swelling and enhancement, consistent with cellulitis, is without change. There is mild stranding in the postseptal intraconal orbital fat which is also stable. There is no defined abscess. Left globe shows mild proptosis but is otherwise unremarkable. The soft tissue swelling and enhancement extends from the left periorbital soft tissues to the left temporal, left forehead and left cheek soft tissues and, to a moderate degree, in the same locations on the right. There is mild bilateral ethmoid and frontal sinus mucosal thickening with minor maxillary and anterior sphenoid sinus mucosal thickening. The ostiomeatal complexes are narrowed by mucosal thickening bilaterally. IMPRESSION: 1. There has been no significant change, neither worsening or improvement, when compared to the prior orbital CT. 2. There is still significant left periorbital soft tissue cellulitis with milder stranding in the intraconal postseptal left orbital fat also suggesting infection. 3. There is node formed abscess. Electronically Signed   By: Amie Portland M.D.   On: 04/26/2015 14:22    Scheduled Meds: . artificial tears   Left Eye TID  . heparin  5,000 Units Subcutaneous 3 times per day  . insulin aspart  0-15 Units Subcutaneous TID WC  . insulin aspart  12 Units Subcutaneous TID WC  . insulin detemir  15 Units Subcutaneous QHS  . lisinopril  5 mg Oral Daily  . piperacillin-tazobactam (ZOSYN)  IV  3.375 g Intravenous Q8H  . vancomycin  1,000 mg Intravenous Q12H   Continuous Infusions: . sodium chloride 10 mL/hr at 04/25/15 1137    Time spent: 25 minutes  Micole Delehanty L  [facility] [email or web address], password [facility] 04/27/2015, 4:17 PM  LOS: 3 days

## 2015-04-28 LAB — GLUCOSE, CAPILLARY
GLUCOSE-CAPILLARY: 247 mg/dL — AB (ref 65–99)
GLUCOSE-CAPILLARY: 232 mg/dL — AB (ref 65–99)
Glucose-Capillary: 313 mg/dL — ABNORMAL HIGH (ref 65–99)
Glucose-Capillary: 401 mg/dL — ABNORMAL HIGH (ref 65–99)

## 2015-04-28 LAB — WOUND CULTURE

## 2015-04-28 MED ORDER — INSULIN ASPART 100 UNIT/ML ~~LOC~~ SOLN
20.0000 [IU] | Freq: Three times a day (TID) | SUBCUTANEOUS | Status: DC
  Administered 2015-04-28 – 2015-04-30 (×7): 20 [IU] via SUBCUTANEOUS

## 2015-04-28 MED ORDER — INSULIN DETEMIR 100 UNIT/ML ~~LOC~~ SOLN
25.0000 [IU] | Freq: Every day | SUBCUTANEOUS | Status: DC
  Administered 2015-04-28 – 2015-04-29 (×2): 25 [IU] via SUBCUTANEOUS
  Filled 2015-04-28 (×3): qty 0.25

## 2015-04-28 NOTE — Progress Notes (Signed)
[facility] PROGRESS NOTE  Nicholas Bell JXB:[id number] DOB: [birth year] DOA: 04/24/2015 PCP: No PCP Per Patient  Assessment/Plan:  Principal Problem: orbital cellulitis of left eye: Continue vancomycin and Zosyn. Swelling starting to improve. Repeat CT without abscess. Opthalmology following. Day 2/3 solumedrol Active Problems:   Diabetes mellitus type 2, uncontrolled, without complications (HCC): stopped insulin several months ago, lost insurance. Now high again on solumedrol. Will uptitrate insulin  Code Status:  full Family Communication:  Pt is lucid Disposition Plan:  Per ophthalmology   HPI/Subjective: Pain 4/10. Able to open left eye slightly and less swollen.  Objective: Filed Vitals:   04/28/15 0613  BP: 120/81  Pulse: 83  Temp: 98.6 F (37 C)  Resp: 19    Intake/Output Summary (Last 24 hours) at 04/28/15 1749 Last data filed at 04/28/15 1308  Gross per 24 hour  Intake    708 ml  Output    800 ml  Net    -92 ml   Filed Weights   04/24/15 0834  Weight: 68.04 kg (150 lb)    Exam:   General:  A and o  Heent: left periorbital swelling and left facial swelling improved slightly. Still unable to fully open left eye.  Cardiovascular: RRR without MGR  Respiratory: CTA without WRR  Abdomen: S, NT, ND  Ext: no CCE  Basic Metabolic Panel:  Recent Labs Lab 04/24/15 0901 04/25/15 0400 04/27/15 0805  NA 136 134* 136  K 4.7 4.5 3.8  CL 95* 97* 99*  CO2 GLUCOSE 370* 273* 195*  BUN CREATININE 1.05 1.08 0.90  CALCIUM 9.5 8.8* 8.6*   Liver Function Tests:  Recent Labs Lab 04/25/15 0400  AST 19  ALT 15*  ALKPHOS 80  BILITOT 1.1  PROT 7.0  ALBUMIN 2.5*   No results for input(s): LIPASE, AMYLASE in the last 168 hours. No results for input(s): AMMONIA in the last 168 hours. CBC:  Recent Labs Lab 04/24/15 0901 04/25/15 0400 04/27/15 0212  WBC 9.3 13.4* 8.1  HGB 15.2 12.9* 12.3*  HCT 45.2 40.2 37.5*  MCV 83.2  83.6 82.8  PLT 173 171 181   Cardiac Enzymes: No results for input(s): CKTOTAL, CKMB, CKMBINDEX, TROPONINI in the last 168 hours. BNP (last 3 results) No results for input(s): BNP in the last 8760 hours.  ProBNP (last 3 results) No results for input(s): PROBNP in the last 8760 hours.  CBG:  Recent Labs Lab 04/27/15 1223 04/27/15 1744 04/27/15 2132 04/28/15 0748 04/28/15 1157  GLUCAP 145* 87 292* 313* 401*    Recent Results (from the past 240 hour(s))  Wound culture     Status: None   Collection Time: 04/24/15  9:22 AM  Result Value Ref Range Status   Specimen Description OTHER  Final   Special Requests NONE  Final   Gram Stain   Final    FEW WBC PRESENT,BOTH PMN AND MONONUCLEAR NO SQUAMOUS EPITHELIAL CELLS SEEN ABUNDANT GRAM POSITIVE RODS MODERATE GRAM POSITIVE COCCI IN PAIRS FEW GRAM NEGATIVE RODS    Culture   Final    ABUNDANT STAPHYLOCOCCUS AUREUS Note: RIFAMPIN AND GENTAMICIN SHOULD NOT BE USED AS SINGLE DRUGS FOR TREATMENT OF STAPH INFECTIONS. Performed at [facility]    Report Status 04/28/2015 FINAL  Final   Organism ID, Bacteria STAPHYLOCOCCUS AUREUS  Final      Susceptibility   Staphylococcus aureus - MIC*    CLINDAMYCIN <=0.25 SENSITIVE Sensitive     ERYTHROMYCIN <=  0.25 SENSITIVE Sensitive     GENTAMICIN <=0.5 SENSITIVE Sensitive     LEVOFLOXACIN 0.25 SENSITIVE Sensitive     OXACILLIN 1 SENSITIVE Sensitive     RIFAMPIN <=0.5 SENSITIVE Sensitive     TRIMETH/SULFA <=10 SENSITIVE Sensitive     VANCOMYCIN <=0.5 SENSITIVE Sensitive     TETRACYCLINE <=1 SENSITIVE Sensitive     MOXIFLOXACIN <=0.25 SENSITIVE Sensitive     * ABUNDANT STAPHYLOCOCCUS AUREUS  Culture, blood (routine x 2)     Status: None (Preliminary result)   Collection Time: 04/24/15  1:30 PM  Result Value Ref Range Status   Specimen Description LEFT ANTECUBITAL  Final   Special Requests BOTTLES DRAWN AEROBIC AND ANAEROBIC 8CC EACH  Final   Culture NO GROWTH 4 DAYS  Final    Report Status PENDING  Incomplete  Culture, blood (routine x 2)     Status: None (Preliminary result)   Collection Time: 04/24/15  1:35 PM  Result Value Ref Range Status   Specimen Description BLOOD LEFT HAND  Final   Special Requests BOTTLES DRAWN AEROBIC AND ANAEROBIC 6CC EACH  Final   Culture NO GROWTH 4 DAYS  Final   Report Status PENDING  Incomplete     Studies: No results found.  Scheduled Meds: . artificial tears   Left Eye TID  . heparin  5,000 Units Subcutaneous 3 times per day  . insulin aspart  0-15 Units Subcutaneous TID WC  . insulin aspart  20 Units Subcutaneous TID WC  . insulin detemir  25 Units Subcutaneous QHS  . lisinopril  5 mg Oral Daily  . methylPREDNISolone (SOLU-MEDROL) injection  1,000 mg Intravenous Daily  . piperacillin-tazobactam (ZOSYN)  IV  3.375 g Intravenous Q8H  . vancomycin  1,000 mg Intravenous Q12H   Continuous Infusions: . sodium chloride 10 mL/hr at 04/25/15 1137    Time spent: 25 minutes  Nicholas Bell  [facility] [email or web address], password [email or web address] 04/28/2015, 5:49 PM  LOS: 4 days

## 2015-04-28 NOTE — Care Management Note (Signed)
Case Management Note  Patient Details  Name: Nicholas Bell MRN: [id number] Date of Birth: [birth year]  Subjective/Objective:                    Action/Plan:  Will provide MATCH letter and [facility] on discharge day  Expected Discharge Date:                  Expected Discharge Plan:  Home/Self Care  In-House Referral:     Discharge planning Services  [facility] Consult[facility]  Post Acute Care Choice:    Choice offered to:     DME Arranged:    DME Agency:     HH Arranged:    HH Agency:     Status of Service:  In process, will continue to follow  Medicare Important Message Given:    Date Medicare IM Given:    Medicare IM give by:    Date Additional Medicare IM Given:    Additional Medicare Important Message give by:     If discussed at Long Length of Stay Meetings, dates discussed:    Additional Comments:  Kingsley PlanWile, Taydem Cavagnaro Marie, RN 04/28/2015, 10:53 AM

## 2015-04-28 NOTE — Progress Notes (Signed)
ANTIBIOTIC CONSULT NOTE - FOLLOW UP  Pharmacy Consult for Zosyn and Vancomycin Indication: Left Orbital Cellulitis  No Known Allergies  Patient Measurements: Height: 5\' 8"  (172.7 cm) Weight: 150 lb (68.04 kg) IBW/kg (Calculated) : 68.4   Microbiology: Cx data:  11/11 wound: MSSA 11/11 BCx2>>ngtd  Abx:  Zosyn: 11/11 >> Vancomycin 11/11 >>   VT: 11/14 8 on 750 mg q 12H with SCr 0.90  Assessment: 54 yoM with left orbital cellulitis in setting of poorly controlled DM. CT with no evidence of cellulitis and current cx data growing MSSA.   Goal of Therapy:  Vancomycin trough level 15-20 mcg/ml  Plan:  1. Vancomycin trough previously below desired range and appropriately adjusted to 1000 mg q 12H 2. recommend streamlining abx to Cefazolin or Nafcillin based on present cx data 3. If vancomycin continued will recheck trough in a few days 4. BMP in 1-2 days 5. Following daily   Pollyann SamplesAndy Kaislee Chao, PharmD, BCPS 04/28/2015, 2:49 PM Pager: [phone]

## 2015-04-28 NOTE — Progress Notes (Signed)
S:Subjectively feels pain and swelling is better. Able to open his eye now. Still has diplopia.  Started Solumedrol.  O:   External improved edema. Complete ptosis in primary.  Levator function 4mm OS  VAN: OD: 4 pt cc +2.50  OS: 26 pt cc + 2.50 (blurry - ointment in eye)  Pupils: OD: 2.5 to 2 mm - no APD OS: 2.5 mm - minimally reactive - No obvious APD  Motility: OD: full OS: mild limitation abduction and elevation  Hertel: 19/21 (106)  IOP: 13/13 - Tonopen  Pen Light Exam: Lid/Lashes: OD: WNL OS: mucous mattering, lid edema and erythema improved  C/S: OD: W/Q OS: Trace chemosis(greatly improved) 3+injection  K: OD: clear OS: ointment  A/C: OD: formed and quiet appearing by penlight OS: formed and quiet appearing by penlight  I: OD: round and regular OS: slightly peaked at 6 oclock, no vit or iris prolapse  L: OD: 1+ NSC OS: 1+ NSC   A/P:  1. Left Orbital Cellulitis c/b Poorly Controlled DM: - Continue management w/ IV Vanc and Zosyn- Recommend transition to PO tomorrow  Would recommend 1g IV Solumedrol daily for 3 days total, will discuss benefits of outpatient PO steroids in setting of DM with primary team. From an eye stand point, he may benefit from a 14 day taper of PO steroids.  - Continue to follow daily  2. Chemosis OS: - Largely resolved. Trace chemosis today - Continue ointment for now.   3. Ocular Hypertension OS: - IOP 13/13 today. Resolving with decreased edema.   - Continue to monitor.

## 2015-04-29 DIAGNOSIS — E1165 Type 2 diabetes mellitus with hyperglycemia: Secondary | ICD-10-CM

## 2015-04-29 DIAGNOSIS — H05012 Cellulitis of left orbit: Principal | ICD-10-CM

## 2015-04-29 LAB — CULTURE, BLOOD (ROUTINE X 2)
Culture: NO GROWTH
Culture: NO GROWTH

## 2015-04-29 LAB — GLUCOSE, CAPILLARY
GLUCOSE-CAPILLARY: 163 mg/dL — AB (ref 65–99)
Glucose-Capillary: 226 mg/dL — ABNORMAL HIGH (ref 65–99)
Glucose-Capillary: 165 mg/dL — ABNORMAL HIGH (ref 65–99)
Glucose-Capillary: 162 mg/dL — ABNORMAL HIGH (ref 65–99)

## 2015-04-29 MED ORDER — PREDNISONE 50 MG PO TABS
60.0000 mg | ORAL_TABLET | Freq: Every day | ORAL | Status: DC
  Administered 2015-04-30: 60 mg via ORAL
  Filled 2015-04-29: qty 1

## 2015-04-29 MED ORDER — AMOXICILLIN-POT CLAVULANATE 875-125 MG PO TABS
1.0000 | ORAL_TABLET | Freq: Two times a day (BID) | ORAL | Status: DC
  Administered 2015-04-29 – 2015-04-30 (×3): 1 via ORAL
  Filled 2015-04-29 (×3): qty 1

## 2015-04-29 NOTE — Consult Note (Signed)
S:Less diplopia. Overall doing better. Eye opening op.     O:   External improved edema. Complete ptosis in primary. Levator function 6mm OS  VAN: OD: 4 pt cc +2.50  OS: 10 pt cc + 2.50 (blurry - ointment in eye)  Pupils: OD: 2.5 to 2 mm - no APD OS: 2.5 mm - minimally reactive - No obvious APD  Motility: OD: full OS: mild limitation in abduction  Hertel: 19/21 (106)  IOP: 13/13 - Tonopen  Pen Light Exam: Lid/Lashes: OD: WNL OS: mucous mattering, lid edema and erythema improved  C/S: OD: W/Q OS: 3+ injection  K: OD: clear OS: ointment  A/C: OD: formed and quiet appearing by penlight OS: formed and quiet appearing by penlight  I: OD: round and regular OS: slightly peaked at 6 oclock, no vit or iris prolapse  L: OD: 1+ NSC OS: 1+ NSC   A/P:  1. Left Orbital Cellulitis c/b Poorly Controlled DM: - OK from ophthalmic point of view to transition to PO Abx - OK to transition to PO steroids w/ 14 day Taper, if acceptable from diabetic stand point.   2. Chemosis OS: - Resolved - Stop Ointment.   3. Ocular Hypertension OS: - IOP 13/13 today.  - Resolved   Disposition:  Recommend observation for 24 hours on PO Abx/steroids prior to discharge.  Recommend follow up with Dr. Lauris ChromanShah/Ariadna Setter 1-2 days after discharge for continued observation of oribital cellulitis.     Please  Call with any questions or concerns- [phone].  Mack HookAndrew Gurshan [facility]

## 2015-04-29 NOTE — Progress Notes (Signed)
[facility] PROGRESS NOTE  Christene LyeKim L Blow UJW:[id number]:[id number] DOB: [birth year] DOA: 04/24/2015  PCP: No PCP Per Patient  Brief HPI: 54 year old African-American male with a past medical history of diabetes, who is noncompliant presented with a one-week complaint of worsening left eye pain and swelling. Patient was diagnosis having orbital cellulitis and was hospitalized for further management.  Past medical history:  Past Medical History  Diagnosis Date  . Diabetes mellitus     Consultants: ophthalmology  Procedures: none  Antibiotics: Was on vancomycin and Zosyn Changed to Augmentin today  Subjective: Patient feels much better. Able to open his eyes, much more than before. Denies any pain.  Objective: Vital Signs  Filed Vitals:   04/28/15 0613 04/28/15 1430 04/28/15 1900 04/29/15 0457  BP: 120/81 143/78 153/90 156/96  Pulse: 83 84 93 82  Temp: 98.6 F (37 C) 97.9 F (36.6 C) 98.7 F (37.1 C) 98 F (36.7 C)  TempSrc: Oral Oral Oral Oral  Resp: 19 18 18 19   Height:      Weight:      SpO2: 100% 99% 100% 100%    Intake/Output Summary (Last 24 hours) at 04/29/15 1045 Last data filed at 04/29/15 0900  Gross per 24 hour  Intake   1602 ml  Output   1201 ml  Net    401 ml   Filed Weights   04/24/15 0834  Weight: 68.04 kg (150 lb)    General appearance: alert, cooperative, appears stated age and no distress Left eye is noted to be swollen but much better compared to previously, per patient. Resp: clear to auscultation bilaterally Cardio: regular rate and rhythm, S1, S2 normal, no murmur, click, rub or gallop GI: soft, non-tender; bowel sounds normal; no masses,  no organomegaly Extremities: extremities normal, atraumatic, no cyanosis or edema Neurologic: no focal deficits  Lab Results:  Basic Metabolic Panel:  Recent Labs Lab 04/24/15 0901 04/25/15 0400 04/27/15 0805  NA 136 134* 136  K 4.7 4.5 3.8  CL 95* 97* 99*  CO2 29 25 30   GLUCOSE 370* 273*  195*  BUN 13 12 12   CREATININE 1.05 1.08 0.90  CALCIUM 9.5 8.8* 8.6*   Liver Function Tests:  Recent Labs Lab 04/25/15 0400  AST 19  ALT 15*  ALKPHOS 80  BILITOT 1.1  PROT 7.0  ALBUMIN 2.5*   CBC:  Recent Labs Lab 04/24/15 0901 04/25/15 0400 04/27/15 0212  WBC 9.3 13.4* 8.1  HGB 15.2 12.9* 12.3*  HCT 45.2 40.2 37.5*  MCV 83.2 83.6 82.8  PLT 173 171 181   CBG:  Recent Labs Lab 04/28/15 0748 04/28/15 1157 04/28/15 1733 04/28/15 2058 04/29/15 0744  GLUCAP 313* 401* 247* 232* 226*    Recent Results (from the past 240 hour(s))  Wound culture     Status: None   Collection Time: 04/24/15  9:22 AM  Result Value Ref Range Status   Specimen Description OTHER  Final   Special Requests NONE  Final   Gram Stain   Final    FEW WBC PRESENT,BOTH PMN AND MONONUCLEAR NO SQUAMOUS EPITHELIAL CELLS SEEN ABUNDANT GRAM POSITIVE RODS MODERATE GRAM POSITIVE COCCI IN PAIRS FEW GRAM NEGATIVE RODS    Culture   Final    ABUNDANT STAPHYLOCOCCUS AUREUS Note: RIFAMPIN AND GENTAMICIN SHOULD NOT BE USED AS SINGLE DRUGS FOR TREATMENT OF STAPH INFECTIONS. Performed at [facility]    Report Status 04/28/2015 FINAL  Final   Organism ID, Bacteria STAPHYLOCOCCUS AUREUS  Final  Susceptibility   Staphylococcus aureus - MIC*    CLINDAMYCIN <=0.25 SENSITIVE Sensitive     ERYTHROMYCIN <=0.25 SENSITIVE Sensitive     GENTAMICIN <=0.5 SENSITIVE Sensitive     LEVOFLOXACIN 0.25 SENSITIVE Sensitive     OXACILLIN 1 SENSITIVE Sensitive     RIFAMPIN <=0.5 SENSITIVE Sensitive     TRIMETH/SULFA <=10 SENSITIVE Sensitive     VANCOMYCIN <=0.5 SENSITIVE Sensitive     TETRACYCLINE <=1 SENSITIVE Sensitive     MOXIFLOXACIN <=0.25 SENSITIVE Sensitive     * ABUNDANT STAPHYLOCOCCUS AUREUS  Culture, blood (routine x 2)     Status: None   Collection Time: 04/24/15  1:30 PM  Result Value Ref Range Status   Specimen Description BLOOD LEFT ANTECUBITAL  Final   Special Requests BOTTLES DRAWN  AEROBIC AND ANAEROBIC 8CC EACH  Final   Culture NO GROWTH 5 DAYS  Final   Report Status 04/29/2015 FINAL  Final  Culture, blood (routine x 2)     Status: None   Collection Time: 04/24/15  1:35 PM  Result Value Ref Range Status   Specimen Description BLOOD LEFT HAND  Final   Special Requests BOTTLES DRAWN AEROBIC AND ANAEROBIC 6CC EACH  Final   Culture NO GROWTH 5 DAYS  Final   Report Status 04/29/2015 FINAL  Final      Studies/Results: No results found.  Medications:  Scheduled: . amoxicillin-clavulanate  1 tablet Oral Q12H  . artificial tears   Left Eye TID  . heparin  5,000 Units Subcutaneous 3 times per day  . insulin aspart  0-15 Units Subcutaneous TID WC  . insulin aspart  20 Units Subcutaneous TID WC  . insulin detemir  25 Units Subcutaneous QHS  . lisinopril  5 mg Oral Daily  . [START ON 04/30/2015] predniSONE  60 mg Oral Q breakfast   Continuous: . sodium chloride 10 mL/hr at 04/25/15 1137   AVW:[id number] **OR** acetaminophen, hydrALAZINE, HYDROcodone-acetaminophen, ibuprofen, morphine injection, ondansetron **OR** ondansetron (ZOFRAN) IV  Assessment/Plan:  Principal Problem:   Orbital cellulitis Active Problems:   Diabetes mellitus type 2, uncontrolled, without complications (HCC)    Orbital cellulitis of left eye Patient being followed by ophthalmology. Patient was started on vancomycin and Zosyn. Patient was also started on intravenous Solu-Medrol. He has completed 3 days of the Solu-Medrol today. Ophthalmology recommends outpatient oral taper. This will be initiated tomorrow. They also recommend changing to oral antibiotics today. Will change to Augmentin.  Diabetes mellitus type 2, uncontrolled, without complications Patient stopped insulin several months ago, lost insurance. HbA1c is 13. Patient will be discharged on Lantus. He will need close follow-up with his primary care physician, [facility] in [place].  DVT Prophylaxis: subcutaneous  heparin    Code Status: full code  Family Communication: discussed with the patient  Disposition Plan: continue to mobilize. Anticipate discharge tomorrow.     LOS: 5 days   [facility]  [facility] Pager [phone] 04/29/2015, 10:45 AM  If 7PM-7AM, please contact night-coverage at [email or web address], password [facility]

## 2015-04-29 NOTE — Progress Notes (Signed)
Results for Christene LyeJOHNSON, Olando L (MRN [id number]) as of 04/29/2015 13:09  Ref. Range 04/28/2015 11:57 04/28/2015 17:33 04/28/2015 20:58 04/29/2015 07:44 04/29/2015 11:38  Glucose-Capillary Latest Ref Range: 65-99 mg/dL 409401 (H) 811247 (H) 914232 (H) 226 (H) 165 (H)   Note blood sugars increased with large dose of Solumedrol daily.  May consider increasing Novolog frequency to q 4 hours while on steroids.  Thanks, Beryl MeagerJenny Tevan Marian, RN, BC-ADM Inpatient Diabetes Coordinator Pager [phone] (8a-5p)

## 2015-04-30 LAB — GLUCOSE, CAPILLARY
GLUCOSE-CAPILLARY: 164 mg/dL — AB (ref 65–99)
GLUCOSE-CAPILLARY: 156 mg/dL — AB (ref 65–99)

## 2015-04-30 MED ORDER — HYDROCODONE-ACETAMINOPHEN 5-325 MG PO TABS: 1.0000 | ORAL_TABLET | Freq: Four times a day (QID) | ORAL | Status: AC | PRN

## 2015-04-30 MED ORDER — AMOXICILLIN-POT CLAVULANATE 875-125 MG PO TABS: 1.0000 | ORAL_TABLET | Freq: Two times a day (BID) | ORAL | Status: AC

## 2015-04-30 MED ORDER — LISINOPRIL 10 MG PO TABS: 10.0000 mg | ORAL_TABLET | Freq: Every day | ORAL | Status: DC

## 2015-04-30 MED ORDER — INSULIN DETEMIR 100 UNIT/ML FLEXPEN: 20.0000 [IU] | PEN_INJECTOR | Freq: Every day | SUBCUTANEOUS | Status: DC

## 2015-04-30 MED ORDER — PREDNISONE 20 MG PO TABS: ORAL_TABLET | ORAL | Status: AC

## 2015-04-30 NOTE — Discharge Summary (Signed)
[facility]  Physician Discharge Summary   Patient ID: Nicholas Bell MRN: [id number] DOB/AGE: October 11, 1960 54 y.o.  Admit date: 04/24/2015 Discharge date: 04/30/2015  PCP: No PCP Per Patient  DISCHARGE DIAGNOSES:  Principal Problem:   Orbital cellulitis Active Problems:   Diabetes mellitus type 2, uncontrolled, without complications (HCC)   RECOMMENDATIONS FOR OUTPATIENT FOLLOW UP: 1. Patient to follow-up with ophthalmology on Monday 2. Follow-up with primary care physician or [facility] for [facility] management   DISCHARGE CONDITION: fair  Diet recommendation: modified carbohydrate  Filed Weights   04/24/15 0834 04/29/15 2213  Weight: 68.04 kg (150 lb) 66.271 kg (146 lb 1.6 oz)    INITIAL HISTORY: 54 year old African-American male with a past medical history of diabetes, who is noncompliant presented with a one-week complaint of worsening left eye pain and swelling. Patient was diagnosis having orbital cellulitis and was hospitalized for further management.  Consultations:  ophthalmology   HOSPITAL COURSE:   Orbital cellulitis of left eye Patient was admitted to the hospital. Patient was started on broad-spectrum antibiotics. Patient was seen by ophthalmology. Patient was also given Solu-Medrol per ophthalmology recommendations. He'll be discharged on oral antibiotics and a tapering dose of steroids. They will follow-up with patient on Monday. Symptoms are improved. He is able to open his left eye better than what he could then he was admitted. Swelling is improved as well.  Diabetes mellitus type 2, uncontrolled, without complications Patient stopped insulin several months ago, lost insurance. HbA1c is 13. Patient will be discharged on Levemir. He will need close follow-up with his primary care physician, [facility] in [place]. If he's unable to see them due to insurance issues, referral has been sent to [facility] as well. Patient told to be compliant with his medication regimen.  Overall improved. Okay for discharge today.   PERTINENT LABS:  The results of significant diagnostics from this hospitalization (including imaging, microbiology, ancillary and laboratory) are listed below for reference.    Microbiology: Recent Results (from the past 240 hour(s))  Wound culture     Status: None   Collection Time: 04/24/15  9:22 AM  Result Value Ref Range Status   Specimen Description OTHER  Final   Special Requests NONE  Final   Gram Stain   Final    FEW WBC PRESENT,BOTH PMN AND MONONUCLEAR NO SQUAMOUS EPITHELIAL CELLS SEEN ABUNDANT GRAM POSITIVE RODS MODERATE GRAM POSITIVE COCCI IN PAIRS FEW GRAM NEGATIVE RODS    Culture   Final    ABUNDANT STAPHYLOCOCCUS AUREUS Note: RIFAMPIN AND GENTAMICIN SHOULD NOT BE USED AS SINGLE DRUGS FOR TREATMENT OF STAPH INFECTIONS. Performed at [facility]    Report Status 04/28/2015 FINAL  Final   Organism ID, Bacteria STAPHYLOCOCCUS AUREUS  Final      Susceptibility   Staphylococcus aureus - MIC*    CLINDAMYCIN <=0.25 SENSITIVE Sensitive     ERYTHROMYCIN <=0.25 SENSITIVE Sensitive     GENTAMICIN <=0.5 SENSITIVE Sensitive     LEVOFLOXACIN 0.25 SENSITIVE Sensitive     OXACILLIN 1 SENSITIVE Sensitive     RIFAMPIN <=0.5 SENSITIVE Sensitive     TRIMETH/SULFA <=10 SENSITIVE Sensitive     VANCOMYCIN <=0.5 SENSITIVE Sensitive     TETRACYCLINE <=1 SENSITIVE Sensitive     MOXIFLOXACIN <=0.25 SENSITIVE Sensitive     * ABUNDANT STAPHYLOCOCCUS AUREUS  Culture, blood (routine x 2)     Status: None   Collection Time: 04/24/15  1:30 PM  Result  Value Ref Range Status   Specimen Description BLOOD LEFT ANTECUBITAL  Final   Special Requests BOTTLES DRAWN AEROBIC AND ANAEROBIC 8CC EACH  Final   Culture NO GROWTH 5 DAYS  Final   Report Status 04/29/2015 FINAL  Final  Culture, blood (routine x 2)     Status: None   Collection Time: 04/24/15  1:35 PM    Result Value Ref Range Status   Specimen Description BLOOD LEFT HAND  Final   Special Requests BOTTLES DRAWN AEROBIC AND ANAEROBIC [facility] EACH  Final   Culture NO GROWTH 5 DAYS  Final   Report Status 04/29/2015 FINAL  Final     Labs: Basic Metabolic Panel:  Recent Labs Lab 04/24/15 0901 04/25/15 0400 04/27/15 0805  NA 136 134* 136  K 4.7 4.5 3.8  CL 95* 97* 99*  CO2 29 25 30   GLUCOSE 370* 273* 195*  BUN 13 12 12   CREATININE 1.05 1.08 0.90  CALCIUM 9.5 8.8* 8.6*   Liver Function Tests:  Recent Labs Lab 04/25/15 0400  AST 19  ALT 15*  ALKPHOS 80  BILITOT 1.1  PROT 7.0  ALBUMIN 2.5*   CBC:  Recent Labs Lab 04/24/15 0901 04/25/15 0400 04/27/15 0212  WBC 9.3 13.4* 8.1  HGB 15.2 12.9* 12.3*  HCT 45.2 40.2 37.5*  MCV 83.2 83.6 82.8  PLT 173 171 181   CBG:  Recent Labs Lab 04/29/15 1138 04/29/15 1634 04/29/15 2131 04/30/15 0738 04/30/15 1225  GLUCAP 165* 163* 162* 164* 156*     IMAGING STUDIES Ct Orbits W/cm  04/26/2015  CLINICAL DATA:  Followup for left orbital and preseptal cellulitis. No improvement in 2 days with antibiotic therapy. EXAM: CT ORBITS WITH CONTRAST TECHNIQUE: Multidetector CT imaging of the orbits was performed following the bolus administration of intravenous contrast. CONTRAST:  OMNIPAQUE IOHEXOL 300 MG/ML  SOLN COMPARISON:  04/24/2015 FINDINGS: Orbits: Left preseptal periorbital soft tissue swelling and enhancement, consistent with cellulitis, is without change. There is mild stranding in the postseptal intraconal orbital fat which is also stable. There is no defined abscess. Left globe shows mild proptosis but is otherwise unremarkable. The soft tissue swelling and enhancement extends from the left periorbital soft tissues to the left temporal, left forehead and left cheek soft tissues and, to a moderate degree, in the same locations on the right. There is mild bilateral ethmoid and frontal sinus mucosal thickening with minor  maxillary and anterior sphenoid sinus mucosal thickening. The ostiomeatal complexes are narrowed by mucosal thickening bilaterally. IMPRESSION: 1. There has been no significant change, neither worsening or improvement, when compared to the prior orbital CT. 2. There is still significant left periorbital soft tissue cellulitis with milder stranding in the intraconal postseptal left orbital fat also suggesting infection. 3. There is node formed abscess. Electronically Signed   By: Amie Portland M.D.   On: 04/26/2015 14:22   Ct Orbits W/cm  04/24/2015  CLINICAL DATA:  Orbital cellulitis. Swelling, redness, and left eye drainage for 1 week. Generalized mal lays, headache, and congestion for 2 weeks. EXAM: CT ORBITS WITH CONTRAST TECHNIQUE: Multidetector CT imaging of the orbits was performed following the bolus administration of intravenous contrast. CONTRAST:  75mL OMNIPAQUE IOHEXOL 300 MG/ML  SOLN COMPARISON:  None. FINDINGS: There is moderate left periorbital preseptal soft tissue swelling extending both superior and inferior to the orbit as well as laterally into the face. There is mild left-sided proptosis. A small amount of low density anterior to the left globe may reflect  a small amount of fluid underneath the eyelid. No organized, focal fluid collection is identified. There is mild stranding of the intraconal fat on the left. No retrobulbar mass or fluid collection is identified. No primary extraocular muscle abnormality is identified. The right orbit is unremarkable. Globes appear intact. Mild bilateral frontal and ethmoid sinus mucosal thickening is noted. The mastoid air cells are clear. No fracture is identified. The visualized portion of the brain is unremarkable. IMPRESSION: Moderate left periorbital soft tissue swelling consistent with cellulitis. There is also left proptosis and mild intraconal fat stranding concerning for postseptal cellulitis as well. Electronically Signed   By: Sebastian Ache M.D.    On: 04/24/2015 10:55    DISCHARGE EXAMINATION: Filed Vitals:   04/29/15 1345 04/29/15 2213 04/30/15 0541 04/30/15 0618  BP: 139/70 155/92 176/97 144/87  Pulse: 83 82 88   Temp: 97.5 F (36.4 C) 98.1 F (36.7 C) 98.1 F (36.7 C)   TempSrc: Oral Oral Oral   Resp: Height:   (1.727 m)    Weight:  66.271 kg (146 lb 1.6 oz)    SpO2: 100% 99% 100%    General appearance: alert, cooperative, appears stated age and no distress Swelling of the left eye has improved. Patient able to open his left eye now. Resp: clear to auscultation bilaterally Cardio: regular rate and rhythm, S1, S2 normal, no murmur, click, rub or gallop GI: soft, non-tender; bowel sounds normal; no masses,  no organomegaly Extremities: extremities normal, atraumatic, no cyanosis or edema  DISPOSITION: home  Discharge Instructions    Call MD for:  extreme fatigue    Complete by:  As directed      Call MD for:  persistant dizziness or light-headedness    Complete by:  As directed      Call MD for:  persistant nausea and vomiting    Complete by:  As directed      Call MD for:  severe uncontrolled pain    Complete by:  As directed      Call MD for:  temperature >100.4    Complete by:  As directed      Diet Carb Modified    Complete by:  As directed      Discharge instructions    Complete by:  As directed   Please be sure to follow up with the eye doctor on Monday. Take your medications as prescribed.  You were cared for by a hospitalist during your hospital stay. If you have any questions about your discharge medications or the care you received while you were in the hospital after you are discharged, you can call the unit and asked to speak with the hospitalist on call if the hospitalist that took care of you is not available. Once you are discharged, your primary care physician will handle any further medical issues. Please note that NO REFILLS for any discharge medications will be authorized once you  are discharged, as it is imperative that you return to your primary care physician (or establish a relationship with a primary care physician if you do not have one) for your aftercare needs so that they can reassess your need for medications and monitor your lab values. If you do not have a primary care physician, you can call [phone] for a physician referral.     Increase activity slowly    Complete by:  As directed            ALLERGIES:  No Known Allergies   Current Discharge Medication List    START taking these medications   Details  amoxicillin-clavulanate (AUGMENTIN) 875-125 MG tablet Take 1 tablet by mouth every 12 (twelve) hours. For 10 more days. Qty: 20 tablet, Refills: 0    HYDROcodone-acetaminophen (NORCO/VICODIN) 5-325 MG tablet Take 1-2 tablets by mouth every 6 (six) hours as needed for moderate pain. Qty: 20 tablet, Refills: 0    Insulin Detemir (LEVEMIR) 100 UNIT/ML Pen Inject 20 Units into the skin daily at 10 pm. Qty: 15 mL, Refills: 5    lisinopril (PRINIVIL,ZESTRIL) 10 MG tablet Take 1 tablet (10 mg total) by mouth daily. Qty: 30 tablet, Refills: 1    predniSONE (DELTASONE) 20 MG tablet Take 3 tablets once daily for 5 days, then take 2 tablets once daily for 5 days and then take 1 tablet once daily for 5 days, then STOP Qty: 30 tablet, Refills: 0      CONTINUE these medications which have NOT CHANGED   Details  acetaminophen (TYLENOL) 500 MG tablet Take 1,000 mg by mouth every 6 (six) hours as needed for moderate pain.    Cromolyn Sodium (NASAL ALLERGY NA) Place 2 sprays into the nose daily as needed (allergies).    diphenhydrAMINE (BENADRYL) 25 MG tablet Take 50 mg by mouth every 6 (six) hours as needed for allergies.    ibuprofen (ADVIL,MOTRIN) 200 MG tablet Take 600 mg by mouth once as needed for mild pain or moderate pain.       Follow-up Information    Schedule an appointment as soon as possible for a visit with [facility].   Contact information:   [address] [place] [phone]      Follow up with Elwin Mochahristopher Tulip Shah, MD. Go on 05/04/2015.   Specialty:  Ophthalmology   Why:  post hospitalization follow up   Contact information:   [address] [place] [id number] [phone]       TOTAL DISCHARGE TIME: 35 minutes  [facility]ISHNAN,Evian Salguero  [facility] Pager [phone]  04/30/2015, 2:43 PM

## 2015-04-30 NOTE — Discharge Instructions (Signed)
Orbital Cellulitis Orbital cellulitis is an infection in the eye socket (orbit) and the tissues that surround the eye. The infection can spread to the eyelids, eyebrow area, and cheek. It can also cause a pocket of pus to develop around the eye (orbital abscess). In severe cases, the infection can spread to the brain. Orbital cellulitis is a medical emergency. CAUSES The most common cause of this condition is a bacterial infection. The infection usually spreads to the eye socket from another part of the body. The infection may start in:  The nose or sinuses.  The eyelids.  Facial skin.  The bloodstream. RISK FACTORS This condition is more likely to develop in people who have recently had one of the following:  Upper respiratory infection.  Sinus infection.  Eyelid or facial infection.  Eye injury.  Infection that affects the entire body or the bloodstream (systemic infection). SYMPTOMS Symptoms of this condition usually start quickly. Symptoms include:  Eye pain that gets worse with eye movement.  Swelling around the eye.  Eye redness.  Bulging of the eye.  Inability to move the eye.  Double vision.  Fever. DIAGNOSIS This condition may be diagnosed based on your symptoms and an eye exam. You may also have tests to confirm the diagnosis and to check for an orbital abscess. Other tests (cultures) may be done to find out what type of bacteria is causing the infection. Tests may include:  Complete blood count (CBC).  Blood culture.  Nose, sinus, or throat culture.  Imaging studies such as a CT scan or MRI. TREATMENT This condition is usually treated in a hospital. Antibiotic medicines are given directly into a vein through an IV tube.  At first, you may get IV antibiotics to kill bacteria that often cause orbital cellulitis (broad spectrum antibiotics).  Your medicine may be changed if cultures suggest that another antibiotic would be better.  If the IV  antibiotics are working to treat your infection, you may be switched to oral antibiotics and allowed to go home.  In some cases, surgery may be needed to drain an orbital abscess. HOME CARE INSTRUCTIONS  Take medicines only as directed by your health care provider.  Take your antibiotic medicine as directed by your health care provider. Finish the antibiotic even if you start to feel better.  Return to your normal activities as directed by your health care provider. Ask your health care provider what activities are safe for you.  Keep all follow-up visits as directed by your health care provider. This is important. SEEK IMMEDIATE MEDICAL CARE IF:  Your eye pain or swelling returns or it gets worse.  You have any changes in your vision.  You have a fever.   This information is not intended to replace advice given to you by your health care provider. Make sure you discuss any questions you have with your health care provider.   Document Released: 05/24/2001 Document Revised: 10/14/2014 Document Reviewed: 05/26/2014 Elsevier Interactive Patient Education 2016 [employer]  

## 2015-04-30 NOTE — Consult Note (Signed)
S:Less diplopia. Vision better. Doing well.    O:   External:  PF: 4  Levator function 8mm OS  VAN: OD: 4 pt cc +2.50  OS: 8 pt cc + 2.50 (blurry - ointment in eye)  Pupils: OD: 2.5 to 2 mm - no APD OS: 2.5 mm to 2 mm No obvious APD  Motility: OD: full OS: mild limitation in upgaze.   Hertel: 19/21 (106)  IOP: 13/13 - Tonopen  Pen Light Exam: Lid/Lashes: OD: WNL OS:  lid edema and erythema improved  C/S: OD: W/Q OS:2+ injection  K: OD: clear OS: clear  A/C: OD: formed and quiet appearing by penlight OS: formed and quiet appearing by penlight  I: OD: round and regular OS: round, regular  L: OD: 1+ NSC OS: 1+ NSC   A/P:  1. Left Orbital Cellulitis c/b Poorly Controlled DM: - Greatly improved. - OK for outpatient management of orbital cellulitis.  - PO Abx,  Rec 1 week steroid taper.     Disposition:  - Anticipate discharge today, but will defer to primary team.  - Discussed follow up with patient. [facility] will [facility] to [facility]. Patient to call Office to schedule appointment: [phone]   Please call with any questions or concerns- (cell) [phone].  Nicholas HookAndrew Jailyn Bell

## 2015-04-30 NOTE — Care Management Note (Signed)
Case Management Note  Patient Details  Name: Nicholas Bell MRN: [id number] Date of Birth: 07/05/1960  Subjective/Objective:                    Action/Plan:   Expected Discharge Date:                  Expected Discharge Plan:  Home/Self Care  In-House Referral:     Discharge planning Services  [facility]  Post Acute Care Choice:    Choice offered to:     DME Arranged:    DME Agency:     HH Arranged:    HH Agency:     Status of Service:  In process, will continue to follow  Medicare Important Message Given:    Date Medicare IM Given:    Medicare IM give by:    Date Additional Medicare IM Given:    Additional Medicare Important Message give by:     If discussed at Long Length of Stay Meetings, dates discussed:  04-30-15  Additional Comments:UR updated   Kingsley PlanWile, Octavius Shin Marie, RN 04/30/2015, 8:44 AM

## 2015-04-30 NOTE — Care Management Note (Signed)
Case Management Note  Patient Details  Name: Nicholas Bell MRN: [id number] Date of Birth: [birth year]  Subjective/Objective:                    Action/Plan:  MATCH letter given and explained . Patient voiced understanding . [facility] . Patient however already has PCP  Expected Discharge Date:                  Expected Discharge Plan:  Home/Self Care  In-House Referral:     Discharge planning Services  [facility]  Post Acute Care Choice:    Choice offered to:     DME Arranged:    DME Agency:     HH Arranged:    HH Agency:     Status of Service:  In process, will continue to follow  Medicare Important Message Given:    Date Medicare IM Given:    Medicare IM give by:    Date Additional Medicare IM Given:    Additional Medicare Important Message give by:     If discussed at Long Length of Stay Meetings, dates discussed:    Additional Comments:  Kingsley PlanWile, Nicholas Bounds Marie, RN 04/30/2015, 11:43 AM

## 2015-05-25 ENCOUNTER — Ambulatory Visit: Payer: Self-pay | Admitting: Family Medicine

## 2015-06-29 ENCOUNTER — Ambulatory Visit (INDEPENDENT_AMBULATORY_CARE_PROVIDER_SITE_OTHER): Payer: Self-pay | Admitting: Family Medicine

## 2015-06-29 ENCOUNTER — Encounter: Payer: Self-pay | Admitting: Family Medicine

## 2015-06-29 VITALS — BP 135/87 | HR 101 | Temp 99.1°F | Resp 20 | Ht 68.0 in | Wt 136.0 lb

## 2015-06-29 DIAGNOSIS — J309 Allergic rhinitis, unspecified: Secondary | ICD-10-CM

## 2015-06-29 DIAGNOSIS — E138 Other specified diabetes mellitus with unspecified complications: Secondary | ICD-10-CM

## 2015-06-29 LAB — GLUCOSE, CAPILLARY: Glucose-Capillary: 372 mg/dL — ABNORMAL HIGH (ref 65–99)

## 2015-06-29 MED ORDER — INSULIN DETEMIR 100 UNIT/ML FLEXPEN: 20.0000 [IU] | PEN_INJECTOR | Freq: Every day | SUBCUTANEOUS | Status: DC

## 2015-06-29 MED ORDER — LISINOPRIL 10 MG PO TABS: 10.0000 mg | ORAL_TABLET | Freq: Every day | ORAL | Status: AC

## 2015-06-29 MED ORDER — FLUTICASONE PROPIONATE 50 MCG/ACT NA SUSP: 2.0000 | Freq: Every day | NASAL | Status: AC

## 2015-06-29 NOTE — Patient Instructions (Signed)
Take your Levemir 20 units a day as prescribed Check your BS twice a day Come back for a nurse visit in 2 weeks to review meter.

## 2015-06-29 NOTE — Progress Notes (Signed)
Patient here to establish care.  Patient complains of HA beginning today with a nagging pain scaled at a 6. Patient complains of congestion. And SOB during the night.  Patient requesting refill on Lisinopril.  CBG: 372 Patient did not take insulin last night. Patient only ate half and egg and cheese biscuit this morning with coffee. Patient has not eaten since.

## 2015-06-30 LAB — LIPID PANEL
CHOL/HDL RATIO: 1.9 ratio (ref <=5.0)
CHOLESTEROL: 171 mg/dL (ref 125–200)
HDL: 88 mg/dL (ref >=40)
LDL CALC: 63 mg/dL (ref <130)
Triglycerides: 101 mg/dL (ref <150)
VLDL: 20 mg/dL (ref <30)

## 2015-06-30 LAB — COMPLETE METABOLIC PANEL WITH GFR
ALBUMIN: 3.3 g/dL — AB (ref 3.6–5.1)
ALK PHOS: 93 U/L (ref 40–115)
ALT: 87 U/L — ABNORMAL HIGH (ref 9–46)
AST: 45 U/L — AB (ref 10–35)
BUN: 15 mg/dL (ref 7–25)
CO2: 27 mmol/L (ref 20–31)
Calcium: 8.9 mg/dL (ref 8.6–10.3)
Chloride: 98 mmol/L (ref 98–110)
Creat: 0.98 mg/dL (ref 0.70–1.33)
GFR, Est African American: >89 mL/min (ref >=60)
GFR, Est Non African American: 86 mL/min (ref >=60)
GLUCOSE: 316 mg/dL — AB (ref 65–99)
POTASSIUM: 4.9 mmol/L (ref 3.5–5.3)
SODIUM: 134 mmol/L — AB (ref 135–146)
TOTAL PROTEIN: 6.7 g/dL (ref 6.1–8.1)
Total Bilirubin: 0.6 mg/dL (ref 0.2–1.2)

## 2015-06-30 LAB — CBC WITH DIFFERENTIAL/PLATELET
BASOS PCT: 1 % (ref 0–1)
Basophils Absolute: 0 10*3/uL (ref 0.0–0.1)
Eosinophils Absolute: 0.1 10*3/uL (ref 0.0–0.7)
Eosinophils Relative: 2 % (ref 0–5)
HEMATOCRIT: 43.5 % (ref 39.0–52.0)
HEMOGLOBIN: 14 g/dL (ref 13.0–17.0)
LYMPHS ABS: 1.1 10*3/uL (ref 0.7–4.0)
Lymphocytes Relative: 26 % (ref 12–46)
MCH: 27.5 pg (ref 26.0–34.0)
MCHC: 32.2 g/dL (ref 30.0–36.0)
MCV: 85.3 fL (ref 78.0–100.0)
MONO ABS: 0.4 10*3/uL (ref 0.1–1.0)
MONOS PCT: 8 % (ref 3–12)
MPV: 9.9 fL (ref 8.6–12.4)
NEUTROS ABS: 2.8 10*3/uL (ref 1.7–7.7)
Neutrophils Relative %: 63 % (ref 43–77)
Platelets: 175 10*3/uL (ref 150–400)
RBC: 5.1 MIL/uL (ref 4.22–5.81)
RDW: 16 % — ABNORMAL HIGH (ref 11.5–15.5)
WBC: 4.4 10*3/uL (ref 4.0–10.5)

## 2015-07-02 DIAGNOSIS — J309 Allergic rhinitis, unspecified: Secondary | ICD-10-CM | POA: Insufficient documentation

## 2015-07-02 NOTE — Progress Notes (Signed)
Patient ID: Nicholas Bell, male   DOB: 16-Nov-1960, 55 y.o.   MRN: [id number]   Nicholas Bell, is a 55 y.o. male  [id number]  [id number]  DOB - [birth year]  CC:  Chief Complaint  Patient presents with  . Establish Care       HPI: Nicholas Bell is a 55 y.o. male here to establish care. He has a history of poorly controlled diabetes and non-compliance with medical regime. He has been diabetic since 2004.  He reports he currently is taking is insulin off and on.  He has most recently been treated for his diabetes at a clinic in [place]. He is supposed to be on Levemir 20 units bid. He has been prescribed lisinopril for renal protection but is currently out of that. He reports having an adequate supply of Levemir. He has a history of allergic rhinitis. He denies tobacco, alcohol or illicit drug use. He reports having a flu shot this year and having had a pneumonia vaccine.   No Known Allergies Past Medical History  Diagnosis Date  . Diabetes mellitus    Current Outpatient Prescriptions on File Prior to Visit  Medication Sig Dispense Refill  . HYDROcodone-acetaminophen (NORCO/VICODIN) 5-325 MG tablet Take 1-2 tablets by mouth every 6 (six) hours as needed for moderate pain. 20 tablet 0  . acetaminophen (TYLENOL) 500 MG tablet Take 1,000 mg by mouth every 6 (six) hours as needed for moderate pain. Reported on 06/29/2015    . amoxicillin-clavulanate (AUGMENTIN) 875-125 MG tablet Take 1 tablet by mouth every 12 (twelve) hours. For 10 more days. (Patient not taking: Reported on 06/29/2015) 20 tablet 0  . Cromolyn Sodium (NASAL ALLERGY NA) Place 2 sprays into the nose daily as needed (allergies). Reported on 06/29/2015    . diphenhydrAMINE (BENADRYL) 25 MG tablet Take 50 mg by mouth every 6 (six) hours as needed for allergies. Reported on 06/29/2015    . ibuprofen (ADVIL,MOTRIN) 200 MG tablet Take 600 mg by mouth once as needed for mild pain or moderate pain. Reported on 06/29/2015    . predniSONE  (DELTASONE) 20 MG tablet Take 3 tablets once daily for 5 days, then take 2 tablets once daily for 5 days and then take 1 tablet once daily for 5 days, then STOP (Patient not taking: Reported on 06/29/2015) 30 tablet 0   No current facility-administered medications on file prior to visit.   History reviewed. No pertinent family history. Social History   Social History  . Marital Status: Single    Spouse Name: N/A  . Number of Children: N/A  . Years of Education: N/A   Occupational History  . Not on file.   Social History Main Topics  . Smoking status: Passive Smoke Exposure - Never Smoker    Types: Cigarettes  . Smokeless tobacco: Not on file  . Alcohol Use: No  . Drug Use: No  . Sexual Activity: Not on file   Other Topics Concern  . Not on file   Social History Narrative    Review of Systems: Constitutional: Negative for fever, chills, appetite change, weight loss,  Fatigue. Skin: Negative for rashes or lesions of concern. HENT: Negative for ear pain, ear discharge.nose bleeds Eyes: Negative for pain, discharge, redness, itching and visual disturbance. Neck: Negative for pain, stiffness Respiratory: Negative for cough, shortness of breath,   Cardiovascular: Negative for chest pain, palpitations and leg swelling. Gastrointestinal: Negative for abdominal pain, nausea, vomiting, diarrhea, constipations Genitourinary: Negative for dysuria, urgency, frequency, hematuria,  Musculoskeletal: Negative for back pain, joint pain, joint  swelling, and gait problem.Negative for weakness. Neurological: Negative for dizziness, tremors, seizures, syncope,   light-headedness, numbness and headaches.  Hematological: Negative for easy bruising or bleeding Psychiatric/Behavioral: Negative for depression, anxiety, decreased concentration, confusion   Objective:   Filed Vitals:   06/29/15 1542  BP: 135/87  Pulse: 101  Temp: 99.1 F (37.3 C)  Resp: 20    Physical  Exam: Constitutional: Patient appears well-developed and well-nourished. No distress. HENT: Normocephalic, atraumatic, External right and left ear normal. Oropharynx is clear and moist.  Eyes: Conjunctivae and EOM are normal. PERRLA, no scleral icterus. Neck: Normal ROM. Neck supple. No lymphadenopathy, No thyromegaly. CVS: RRR, S1/S2 +, no murmurs, no gallops, no rubs Pulmonary: Effort and breath sounds normal, no stridor, rhonchi, wheezes, rales.  Abdominal: Soft. Normoactive BS,, no distension, tenderness, rebound or guarding.  Musculoskeletal: Normal range of motion. No edema and no tenderness.  Neuro: Alert.Normal muscle tone coordination. Non-focal Skin: Skin is warm and dry. No rash noted. Not diaphoretic. No erythema. No pallor. Psychiatric: Normal mood and affect. Behavior, judgment, thought content normal.  Lab Results  Component Value Date   WBC 4.4 06/29/2015   HGB 14.0 06/29/2015   HCT 43.5 06/29/2015   MCV 85.3 06/29/2015   PLT 175 06/29/2015   Lab Results  Component Value Date   CREATININE 0.98 06/29/2015   BUN 15 06/29/2015   NA 134* 06/29/2015   K 4.9 06/29/2015   CL 98 06/29/2015   CO2 27 06/29/2015    Lab Results  Component Value Date   HGBA1C 13.7* 04/24/2015   Lipid Panel     Component Value Date/Time   CHOL 171 06/29/2015 1634   TRIG 101 06/29/2015 1634   HDL 88 06/29/2015 1634   CHOLHDL 1.9 06/29/2015 1634   VLDL 20 06/29/2015 1634   LDLCALC 63 06/29/2015 1634       Assessment and plan:   1. Diabetes mellitus of other type with complication (HCC)  - COMPLETE METABOLIC PANEL WITH GFR - CBC w/Diff - Lipid panel   Return in about 2 days (around 07/01/2015).  The patient was given clear instructions to go to ER or return to medical center if symptoms don't improve, worsen or new problems develop. The patient verbalized understanding.    Henrietta Hoover FNP  07/02/2015, 8:18 AM

## 2015-07-13 ENCOUNTER — Other Ambulatory Visit (HOSPITAL_COMMUNITY): Payer: Self-pay | Admitting: Family Medicine

## 2015-07-13 ENCOUNTER — Encounter: Payer: Self-pay | Admitting: Family Medicine

## 2015-07-13 ENCOUNTER — Other Ambulatory Visit: Payer: Self-pay | Admitting: Family Medicine

## 2015-07-13 MED ORDER — INSULIN PEN NEEDLE 33G X 4 MM MISC: 1.0000 | Freq: Two times a day (BID) | Status: AC

## 2015-07-13 MED ORDER — ONETOUCH ULTRASOFT LANCETS MISC: Status: AC

## 2015-07-13 MED ORDER — TRUE METRIX METER DEVI: 1.0000 [IU] | Freq: Three times a day (TID) | Status: AC

## 2015-07-13 MED ORDER — GLUCOSE BLOOD VI STRP: ORAL_STRIP | Status: AC

## 2015-07-13 MED ORDER — INSULIN LISPRO 100 UNIT/ML ~~LOC~~ SOLN: 10.0000 [IU] | Freq: Three times a day (TID) | SUBCUTANEOUS | Status: AC

## 2015-07-13 MED FILL — TRUE METRIX TEST STRIP: 25 days supply | Qty: 100 | Fill #0

## 2015-07-13 MED FILL — TRUEPLUS SYR 0.3ML 31GX5/16: 31G X 5/16" | 25 days supply | Qty: 100 | Fill #0

## 2015-07-13 MED FILL — !TRUE METRIX BLOOD GLUCOSE: 365 days supply | Qty: 1 | Fill #0

## 2015-07-13 MED FILL — TRUEplus LANCETS 28G MISC: 25 days supply | Qty: 100 | Fill #0

## 2015-07-13 MED FILL — !NOVOLOG 100UNITS/ML VIAL: 100/ML | 28 days supply | Qty: 10 | Fill #0

## 2015-07-13 NOTE — Progress Notes (Unsigned)
Patient ID: Christene Lye, male   DOB: 11/21/1960, 56 y.o.   MRN: [id number]

## 2015-08-17 ENCOUNTER — Encounter: Payer: Self-pay | Admitting: Family Medicine

## 2015-08-17 ENCOUNTER — Ambulatory Visit (INDEPENDENT_AMBULATORY_CARE_PROVIDER_SITE_OTHER): Payer: Self-pay | Admitting: Family Medicine

## 2015-08-17 VITALS — BP 111/60 | HR 72 | Temp 98.0°F | Ht 68.0 in | Wt 134.0 lb

## 2015-08-17 DIAGNOSIS — E138 Other specified diabetes mellitus with unspecified complications: Secondary | ICD-10-CM

## 2015-08-17 LAB — GLUCOSE, CAPILLARY: Glucose-Capillary: 260 mg/dL — ABNORMAL HIGH (ref 65–99)

## 2015-08-17 MED ORDER — INSULIN DETEMIR 100 UNIT/ML FLEXPEN: 30.0000 [IU] | PEN_INJECTOR | Freq: Every day | SUBCUTANEOUS | Status: AC

## 2015-08-17 NOTE — Progress Notes (Signed)
Patient ID: Nicholas Bell, male   DOB: [birth year], 55 y.o.   MRN: [id number]   Nicholas Bell, is a 55 y.o. male  [id number]  [id number]  DOB - Dec 31, 1960  CC:  Chief Complaint  Patient presents with  . Diabetes    follow up, feeling tired, blood glucose running 260       HPI: Nicholas Bell is a 55 y.o. male here for follow-up of uncontrolled diabetes. I saw him in mid January, at which time he was only using his insulins sporatically. I refilled his prescription and put him on Levemir 20 at HS and Novolog 10 three times a day before meals.  He reports taking his Levemir 20 regularly but usually only taking his Novolog twice a day as he eats irregularly. He reports jod fasting blood sigars are around 220 and his 4-7 pm sugars are 260 tp sometimes over 300. He did not bring his meter today. He is not due for an A1C until mid April. He reports fatigue, weight loss. Denies chest pain or shortness of breath.  No Known Allergies Past Medical History  Diagnosis Date  . Diabetes mellitus    Current Outpatient Prescriptions on File Prior to Visit  Medication Sig Dispense Refill  . acetaminophen (TYLENOL) 500 MG tablet Take 1,000 mg by mouth every 6 (six) hours as needed for moderate pain. Reported on 06/29/2015    . amoxicillin-clavulanate (AUGMENTIN) 875-125 MG tablet Take 1 tablet by mouth every 12 (twelve) hours. For 10 more days. (Patient not taking: Reported on 06/29/2015) 20 tablet 0  . Blood Glucose Monitoring Suppl (TRUE METRIX METER) DEVI 1 Units by Does not apply route 3 (three) times daily before meals. 1 Device 0  . Cromolyn Sodium (NASAL ALLERGY NA) Place 2 sprays into the nose daily as needed (allergies). Reported on 06/29/2015    . diphenhydrAMINE (BENADRYL) 25 MG tablet Take 50 mg by mouth every 6 (six) hours as needed for allergies. Reported on 06/29/2015    . fluticasone (FLONASE) 50 MCG/ACT nasal spray Place 2 sprays into both nostrils daily. 16 g 6  . glucose blood (COOL BLOOD  GLUCOSE TEST STRIPS) test strip Use as instructed 100 each 12  . HYDROcodone-acetaminophen (NORCO/VICODIN) 5-325 MG tablet Take 1-2 tablets by mouth every 6 (six) hours as needed for moderate pain. 20 tablet 0  . ibuprofen (ADVIL,MOTRIN) 200 MG tablet Take 600 mg by mouth once as needed for mild pain or moderate pain. Reported on 06/29/2015    . insulin lispro (HUMALOG) 100 UNIT/ML injection Inject 0.1 mLs (10 Units total) into the skin 3 (three) times daily before meals. 10 mL 11  . Insulin Pen Needle (INSUPEN PEN NEEDLES) 33G X 4 MM MISC 1 Container by Does not apply route 2 (two) times daily before a meal. 1 each 11  . Lancets (ONETOUCH ULTRASOFT) lancets Use as instructed 100 each 12  . lisinopril (PRINIVIL,ZESTRIL) 10 MG tablet Take 1 tablet (10 mg total) by mouth daily. 90 tablet 1  . predniSONE (DELTASONE) 20 MG tablet Take 3 tablets once daily for 5 days, then take 2 tablets once daily for 5 days and then take 1 tablet once daily for 5 days, then STOP (Patient not taking: Reported on 06/29/2015) 30 tablet 0   No current facility-administered medications on file prior to visit.   History reviewed. No pertinent family history. Social History   Social History  . Marital Status: Single    Spouse Name: N/A  . Number of Children:  N/A  . Years of Education: N/A   Occupational History  . Not on file.   Social History Main Topics  . Smoking status: Passive Smoke Exposure - Never Smoker    Types: Cigarettes  . Smokeless tobacco: Not on file  . Alcohol Use: No  . Drug Use: No  . Sexual Activity: Not on file   Other Topics Concern  . Not on file   Social History Narrative    Review of Systems: Constitutional: Negative for fever, chills, appetite change. Skin: Negative for rashes or lesions of concern. HENT: Negative for ear pain, ear discharge.nose bleeds Eyes: Negative for pain, discharge, redness, itching and visual disturbance. Neck: Negative for pain, stiffness Respiratory:  Negative for cough, shortness of breath,   Cardiovascular: Negative for chest pain, palpitations and leg swelling. Reports legs feel tight sometimes but do not seem to be swollen Gastrointestinal: Negative for abdominal pain, nausea, vomiting, diarrhea, constipations Genitourinary: Negative for dysuria, urgency, frequency, hematuria,  Musculoskeletal: Positive for back pain. Negative for other joint pain, joint  swelling, and gait problem.Negative for weakness.Positive for some foot pain Neurological: Negative for  tremors, seizures, syncope,   light-headedness, numbness and headaches. Positive for occassional dizziness Hematological: Negative for easy bruising or bleeding Psychiatric/Behavioral: Negative for depression, anxiety, decreased concentration, confusion   Objective:   Filed Vitals:   08/17/15 1536  BP: 111/60  Pulse: 72  Temp: 98 F (36.7 C)    Physical Exam: Constitutional: Patient appears well-developed and well-nourished. No distress. HENT: Normocephalic, atraumatic, External right and left ear normal. Oropharynx is clear and moist.  Eyes: Conjunctivae and EOM are normal. PERRLA, no scleral icterus. Neck: Normal ROM. Neck supple. No lymphadenopathy, No thyromegaly. CVS: RRR, S1/S2 +, no murmurs, no gallops, no rubs Pulmonary: Effort and breath sounds normal, no stridor, rhonchi, wheezes, rales.  Abdominal: Soft. Normoactive BS,, no distension, tenderness, rebound or guarding.  Musculoskeletal: Normal range of motion. No edema and no tenderness.  Neuro: Alert.Normal muscle tone coordination. Non-focal Skin: Skin is warm and dry. No rash noted. Not diaphoretic. No erythema. No pallor.Feet and lower legs are very dry and scaley. Nails are deformed and appear to have fungal infection. Psychiatric: Normal mood and affect. Behavior, judgment, thought content normal.  Lab Results  Component Value Date   WBC 4.4 06/29/2015   HGB 14.0 06/29/2015   HCT 43.5 06/29/2015   MCV  85.3 06/29/2015   PLT 175 06/29/2015   Lab Results  Component Value Date   CREATININE 0.98 06/29/2015   BUN 15 06/29/2015   NA 134* 06/29/2015   K 4.9 06/29/2015   CL 98 06/29/2015   CO2 27 06/29/2015    Lab Results  Component Value Date   HGBA1C 13.7* 04/24/2015   Lipid Panel     Component Value Date/Time   CHOL 171 06/29/2015 1634   TRIG 101 06/29/2015 1634   HDL 88 06/29/2015 1634   CHOLHDL 1.9 06/29/2015 1634   VLDL 20 06/29/2015 1634   LDLCALC 63 06/29/2015 1634       Assessment and plan:   1. Diabetes mellitus of other type with complication (HCC)  - Insulin Detemir (LEVEMIR) 100 UNIT/ML Pen; Inject 30 Units into the skin daily at 10 pm.  Dispense: 15 mL; Refill: 5 -follow-up in mid April and bring meter -Advised to eat regularly and use Novolog regularly.   2. Dry, scaley feet. -Have recommended OTC lotion or cream    Return in about 6 weeks (around 09/28/2015).  The patient  was given clear instructions to go to ER or return to medical center if symptoms don't improve, worsen or new problems develop. The patient verbalized understanding.    Henrietta Hoover FNP  08/17/2015, 3:57 PM

## 2015-08-17 NOTE — Patient Instructions (Addendum)
Increase Levemir to 30 units at 10 pm. Try to eat at least three times a day and regular intervals and take your Novolog before each meal.  Careful with diet watch sweets and other carbs.  Come back the middle of April and bring your meter. Call for any issues before then or if you need refills.

## 2015-09-28 ENCOUNTER — Ambulatory Visit: Payer: Self-pay | Admitting: Family Medicine

## 2016-09-14 ENCOUNTER — Telehealth: Payer: Self-pay | Admitting: Internal Medicine

## 2016-09-14 NOTE — Telephone Encounter (Signed)
RECALL FOR TCS °

## 2016-09-14 NOTE — Telephone Encounter (Signed)
Letter mailed to pt.  

## 2017-05-10 IMAGING — CT CT ORBITS W/ CM
3 of 4 series · 12 of 47 positions shown, 14 images · IV contrast (Omnipaque 300)
Comparison: None.

CLINICAL DATA: Orbital cellulitis. Swelling, redness, and left eye
drainage for 1 week. Generalized mal lays, headache, and congestion
for 2 weeks.

EXAM:
CT ORBITS WITH CONTRAST
TECHNIQUE: Multidetector CT imaging of the orbits was performed following the
bolus administration of intravenous contrast.
CONTRAST:  75mL OMNIPAQUE IOHEXOL 300 MG/ML  SOLN

[Series 2: max st axial · axial · 0.29mm/px · z∈[+110,+188]mm · 7 of 48 slices shown, 9 images]
[im 5/48  brain]
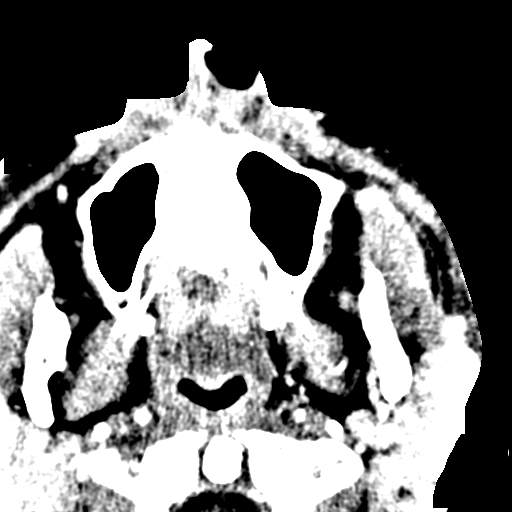
[im 5/48  bone]
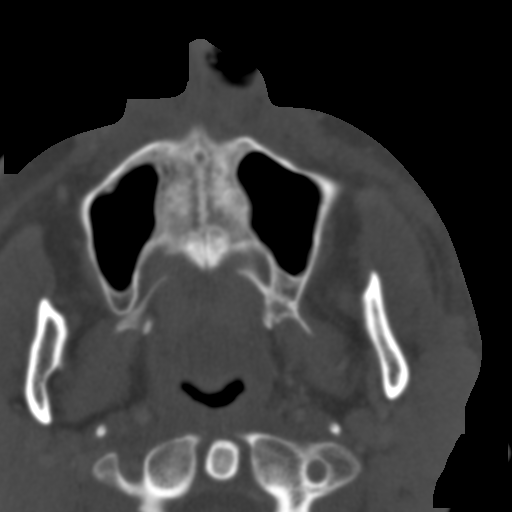
[im 12/48  bone]
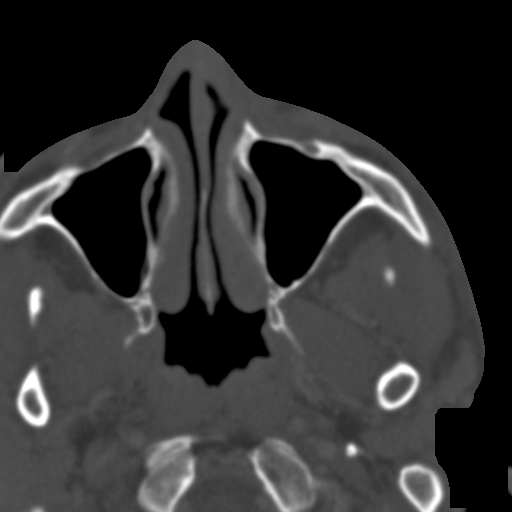
[im 18/48  bone]
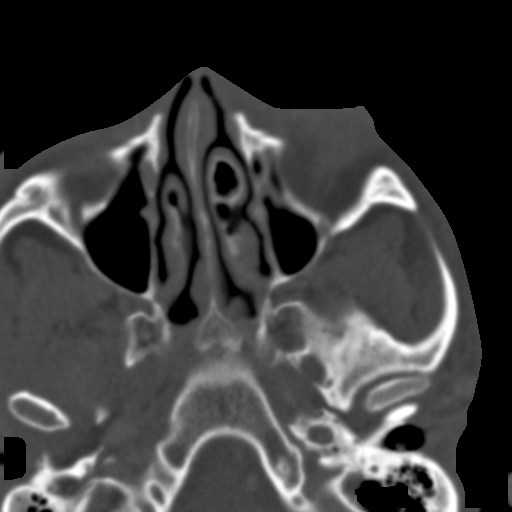
[im 25/48  bone]
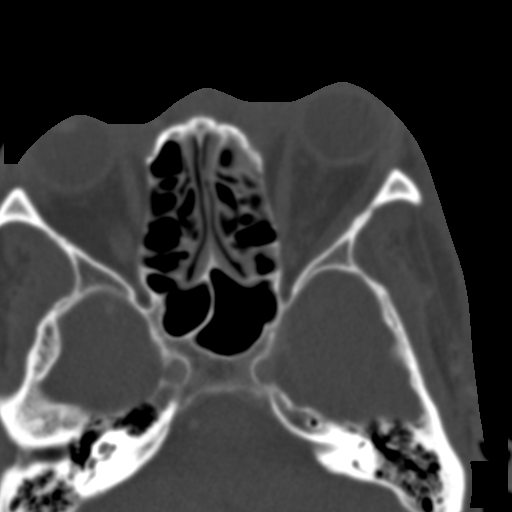
[im 31/48  brain]
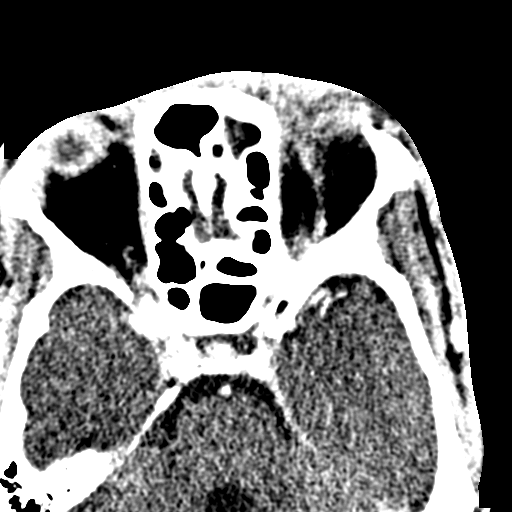
[im 31/48  bone]
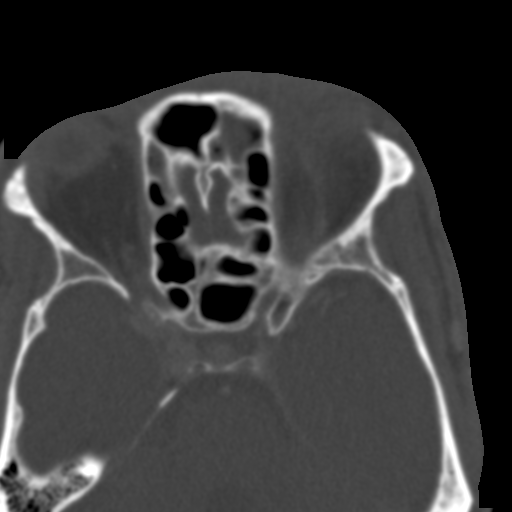
[im 38/48  bone]
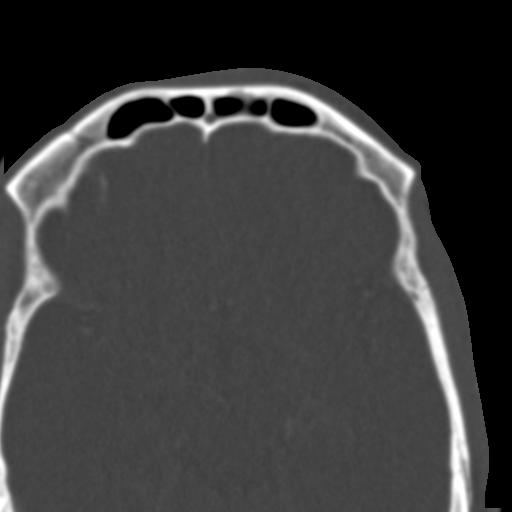
[im 44/48  bone]
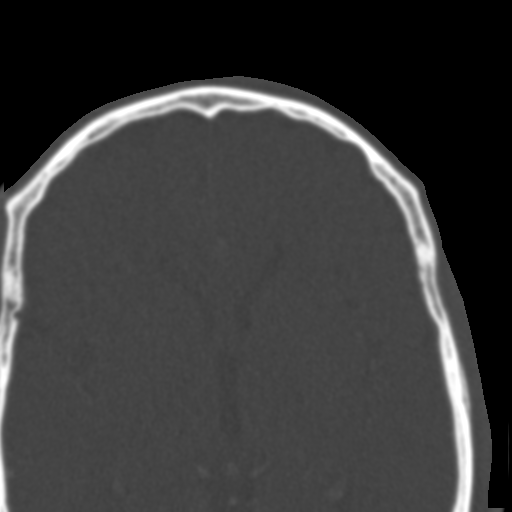

[Series 7: max bone sag · sagittal · 0.18mm/px · 2 of 104 slices shown]
[im 35/104  bone]
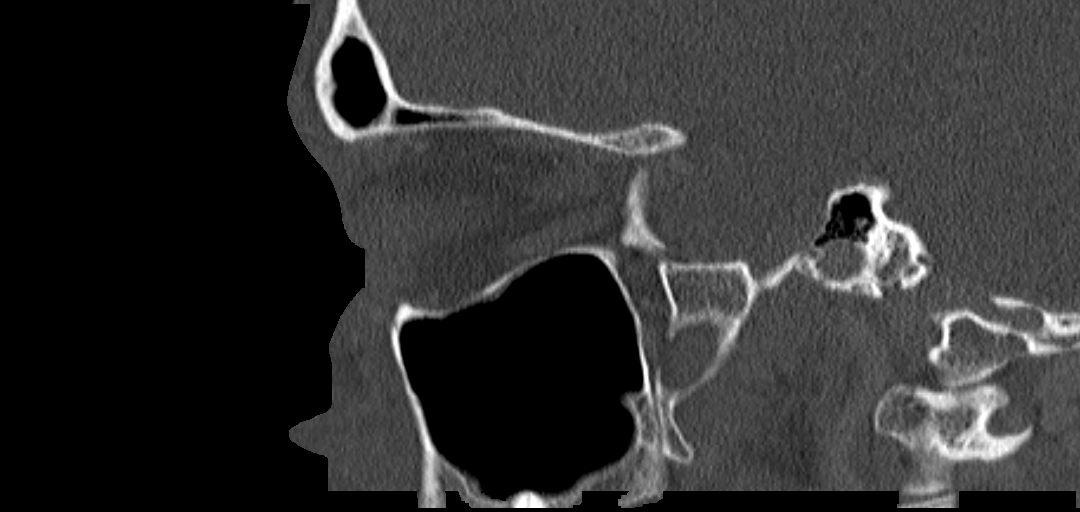
[im 69/104  bone]
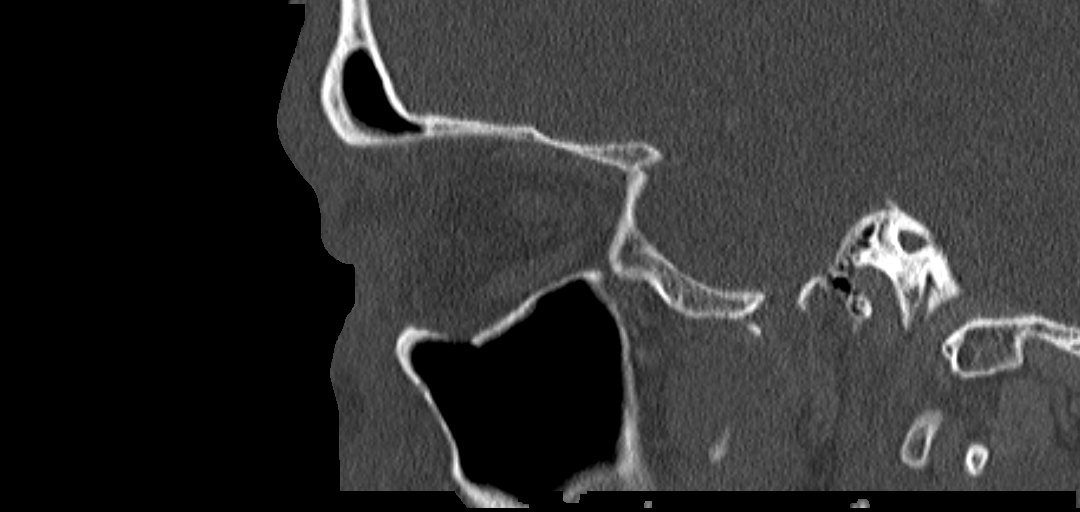

[Series 8: max st coro · coronal · 0.19mm/px · 3 of 83 slices shown]
[im 28/83  bone]
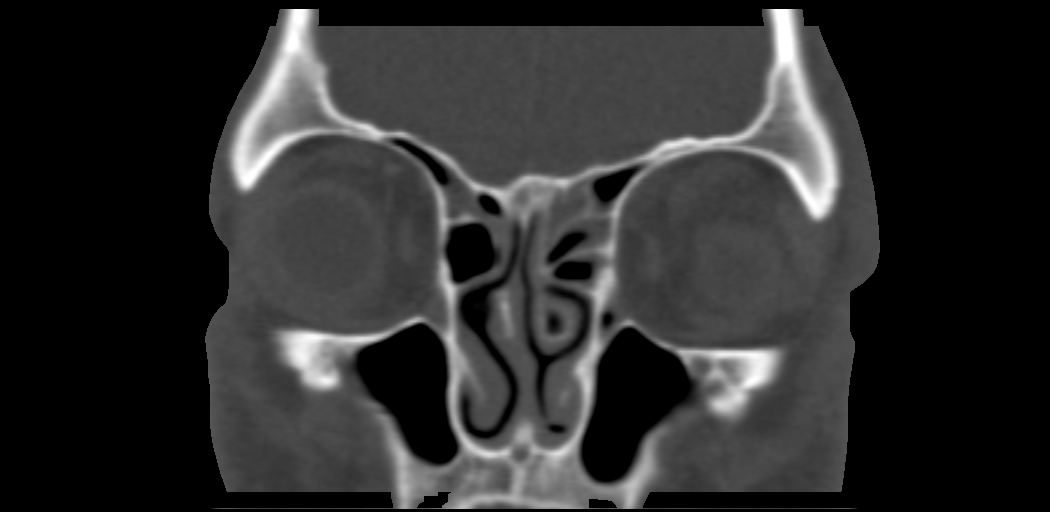
[im 37/83  bone]
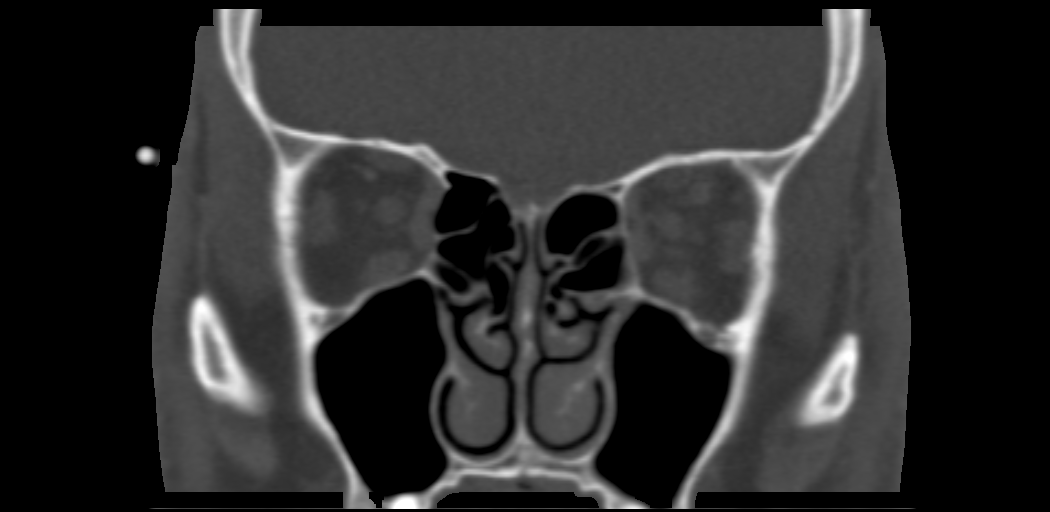
[im 46/83  bone]
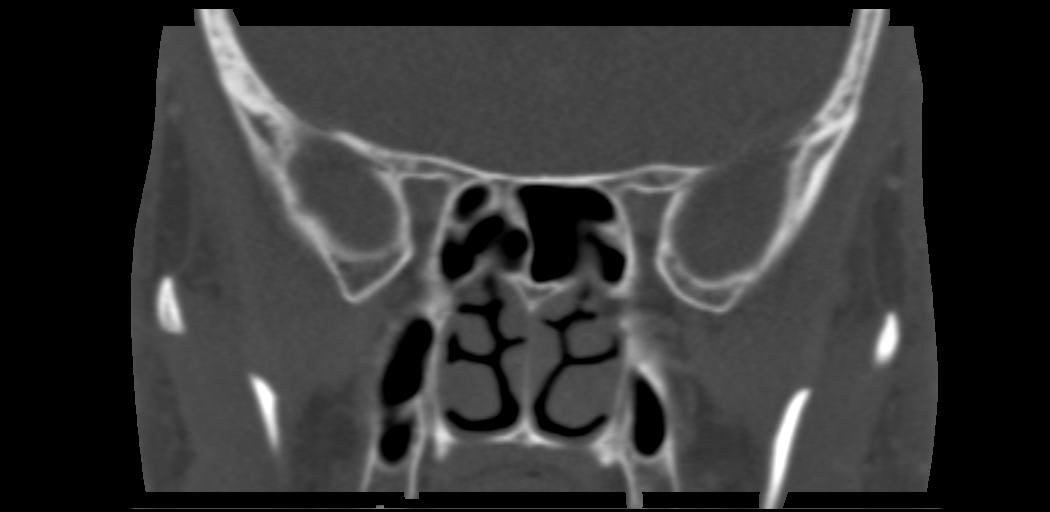

[12 of 47 positions shown; findings below may reference images not displayed]

FINDINGS: There is moderate left periorbital preseptal soft tissue swelling
extending both superior and inferior to the orbit as well as
laterally into the face. There is mild left-sided proptosis. A small
amount of low density anterior to the left globe may reflect a small
amount of fluid underneath the eyelid. No organized, focal fluid
collection is identified. There is mild stranding of the intraconal
fat on the left. No retrobulbar mass or fluid collection is
identified. No primary extraocular muscle abnormality is identified.
The right orbit is unremarkable. Globes appear intact.

Mild bilateral frontal and ethmoid sinus mucosal thickening is
noted. The mastoid air cells are clear. No fracture is identified.
The visualized portion of the brain is unremarkable.
IMPRESSION: Moderate left periorbital soft tissue swelling consistent with
cellulitis. There is also left proptosis and mild intraconal fat
stranding concerning for postseptal cellulitis as well.
